# Patient Record
Sex: Female | Born: 1980 | Hispanic: Yes | Marital: Single | Smoking: Never smoker
Health system: Southern US, Community
[De-identification: ages and names within clinical notes are randomized; demographics above are authoritative.]

## PROBLEM LIST (undated history)

## (undated) DIAGNOSIS — F32A Depression, unspecified: Secondary | ICD-10-CM

## (undated) DIAGNOSIS — F329 Major depressive disorder, single episode, unspecified: Secondary | ICD-10-CM

## (undated) DIAGNOSIS — N809 Endometriosis, unspecified: Secondary | ICD-10-CM

## (undated) DIAGNOSIS — D649 Anemia, unspecified: Secondary | ICD-10-CM

## (undated) DIAGNOSIS — K219 Gastro-esophageal reflux disease without esophagitis: Secondary | ICD-10-CM

## (undated) DIAGNOSIS — J45909 Unspecified asthma, uncomplicated: Secondary | ICD-10-CM

## (undated) DIAGNOSIS — F419 Anxiety disorder, unspecified: Secondary | ICD-10-CM

## (undated) HISTORY — DX: Gastro-esophageal reflux disease without esophagitis: K21.9

## (undated) HISTORY — DX: Anemia, unspecified: D64.9

---

## 2005-09-13 ENCOUNTER — Ambulatory Visit: Payer: Self-pay | Admitting: *Deleted

## 2005-11-05 ENCOUNTER — Ambulatory Visit: Payer: Self-pay | Admitting: *Deleted

## 2005-11-22 ENCOUNTER — Ambulatory Visit: Payer: Self-pay | Admitting: Obstetrics and Gynecology

## 2006-01-11 ENCOUNTER — Ambulatory Visit: Payer: Self-pay | Admitting: Obstetrics and Gynecology

## 2007-03-09 ENCOUNTER — Inpatient Hospital Stay: Payer: Self-pay | Admitting: Obstetrics and Gynecology

## 2008-07-19 ENCOUNTER — Emergency Department: Payer: Self-pay | Admitting: Emergency Medicine

## 2008-07-31 ENCOUNTER — Emergency Department: Payer: Self-pay | Admitting: Emergency Medicine

## 2009-02-26 ENCOUNTER — Emergency Department: Payer: Self-pay | Admitting: Emergency Medicine

## 2010-04-05 ENCOUNTER — Ambulatory Visit: Payer: Self-pay | Admitting: Family Medicine

## 2010-04-28 ENCOUNTER — Ambulatory Visit: Payer: Self-pay | Admitting: Family Medicine

## 2010-06-24 ENCOUNTER — Observation Stay: Payer: Self-pay | Admitting: Obstetrics and Gynecology

## 2010-07-10 ENCOUNTER — Ambulatory Visit: Payer: Self-pay | Admitting: Family Medicine

## 2010-09-24 ENCOUNTER — Observation Stay: Payer: Self-pay | Admitting: Obstetrics and Gynecology

## 2010-10-02 ENCOUNTER — Inpatient Hospital Stay: Payer: Self-pay

## 2010-12-03 ENCOUNTER — Emergency Department: Payer: Self-pay | Admitting: Unknown Physician Specialty

## 2011-11-26 ENCOUNTER — Inpatient Hospital Stay: Payer: Self-pay

## 2011-11-26 DIAGNOSIS — R079 Chest pain, unspecified: Secondary | ICD-10-CM

## 2011-11-26 LAB — CBC WITH DIFFERENTIAL/PLATELET
Basophil #: 0 10*3/uL (ref 0.0–0.1)
Eosinophil #: 0.4 10*3/uL (ref 0.0–0.7)
Eosinophil %: 4.9 %
Lymphocyte #: 1.8 10*3/uL (ref 1.0–3.6)
MCH: 25.1 pg — ABNORMAL LOW (ref 26.0–34.0)
MCHC: 31.5 g/dL — ABNORMAL LOW (ref 32.0–36.0)
MCV: 80 fL (ref 80–100)
Monocyte #: 0.5 x10 3/mm (ref 0.2–0.9)
Platelet: 202 10*3/uL (ref 150–440)
RDW: 13.5 % (ref 11.5–14.5)

## 2011-11-26 LAB — BASIC METABOLIC PANEL
Anion Gap: 11 (ref 7–16)
BUN: 5 mg/dL — ABNORMAL LOW (ref 7–18)
Calcium, Total: 8.9 mg/dL (ref 8.5–10.1)
Co2: 23 mmol/L (ref 21–32)
Creatinine: 0.47 mg/dL — ABNORMAL LOW (ref 0.60–1.30)
EGFR (African American): >60
Osmolality: 287 (ref 275–301)
Potassium: 3.3 mmol/L — ABNORMAL LOW (ref 3.5–5.1)

## 2011-11-26 LAB — CK TOTAL AND CKMB (NOT AT ARMC)
CK, Total: 72 U/L (ref 21–215)
CK-MB: <0.5 ng/mL — ABNORMAL LOW (ref 0.5–3.6)

## 2011-12-18 ENCOUNTER — Inpatient Hospital Stay: Payer: Self-pay

## 2013-02-16 ENCOUNTER — Emergency Department: Payer: Self-pay | Admitting: Emergency Medicine

## 2013-04-30 DIAGNOSIS — J45901 Unspecified asthma with (acute) exacerbation: Secondary | ICD-10-CM | POA: Insufficient documentation

## 2014-08-24 NOTE — H&P (Signed)
    Subjective/Chief Complaint Short of breath    History of Present Illness 34 yo [id number] female at 6438 weeks gestational age by LMP.  Her pregnancy has been complicated by a history of moderate to severe persistent asthma for which she uses albuterol.  She presents with difficulty breathing not relieved by her albuterol inhaler.  She has tried 10 puffs from her albuterol inhaler. In the ED she received 2 duoneb treatments, a double albuterol treatment , and a dose of solumedrol.  With this she has wheezing.  She notes positive fetal movement, no vaginal bleeding, no leakage of fluid, and no contractions.    Past Medical Health Other, No Surgeries   Past Med/Surgical Hx:  Asthma:   ALLERGIES:  No Known Allergies:   HOME MEDICATIONS: Medication Instructions Status  albuterol  Active  multivitamin, prenatal Prenatal Multivitamins tablet 1 tab(s) orally once a day while breastfeeding Active   Family and Social History:   Family History Coronary Artery Disease  Diabetes Mellitus    Social History negative tobacco, negative ETOH, negative Illicit drugs    Place of Living Home   Review of Systems:   Subjective/Chief Complaint As noted in HPI, otherwise negative    Fever/Chills No    Abdominal Pain No    Diarrhea No    Constipation No    Nausea/Vomiting No    SOB/DOE Yes    Chest Pain No    Dysuria No   Physical Exam:   GEN well developed, well nourished, obese    HEENT hearing intact to voice    NECK supple  trachea midline    RESP normal resp effort  wheezing    CARD regular rate    ABD denies tenderness  Gravid    LYMPH negative neck    EXTR negative cyanosis/clubbing, negative edema    SKIN normal to palpation    PSYCH alert    Additional Comments Non Stress Test Performed in ED at bedside: Reactive with baseline 155/+ accels/no decels/mod variability  Vital Signs: BP 125/63, Pulse 89, O2 sats 100% on 2LNC.   Lab Results:  Routine Hem:  22-Jul-13  00:12    Hematocrit (CBC)  34.3     Assessment/Admission Diagnosis 34yo G4P3003 at 38 weeks by LMP with very limited prenatal care with an acute asthma exacerbation.    Plan - admit for management of asthma and surveillance of pregnancy in this setting. - Consult general medicine for medical management of asthma - daily NSTs - Collect GBS - Social work consult   Electronic Signatures: Conard NovakJackson, Stephen D (MD)  (Signed 22-Jul-13 02:34)  Authored: CHIEF COMPLAINT and HISTORY, PAST MEDICAL/SURGIAL HISTORY, ALLERGIES, HOME MEDICATIONS, FAMILY AND SOCIAL HISTORY, REVIEW OF SYSTEMS, PHYSICAL EXAM, LABS, ASSESSMENT AND PLAN   Last Updated: 22-Jul-13 02:34 by Conard NovakJackson, Stephen D (MD)

## 2014-08-24 NOTE — Consult Note (Signed)
PATIENT NAME:  Bailey Richardson, Bailey Richardson MR#:  [id number] DATE OF BIRTH:  1981/02/08  DATE OF CONSULTATION:  11/26/2011  REFERRING PHYSICIAN:  Dr. Jean RosenthalJackson  CONSULTING PHYSICIAN:  Richarda OverlieNayana Judson Tsan, MD  REASON FOR CONSULTATION: Asthma exacerbation   SUBJECTIVE: 34 year old female who presents to the ER with a chief complaint of shortness of breath. The patient had acute onset of dyspnea when she was trying to go to bed yesterday evening. The patient states that she could not catch her breath and this is the worst attack that she has had in a long time. According to ER documentation the patient was tachycardic but her oxygen saturation was fine on room air. She is also [redacted] weeks pregnant. Patient was unable to speak in complete sentences. Patient states that she has a history of asthma and has been using her albuterol inhaler for rescue almost on a daily basis and she used it at least 10 times today prior to coming to the Emergency Department.   PAST SURGICAL HISTORY: History of laparoscopy for endometriosis.   ALLERGIES: No known drug allergies.   HOME MEDICATIONS: Albuterol.   REVIEW OF SYSTEMS: CONSTITUTIONAL: No fever, fatigue, weakness, weight loss. EYES: No blurry vision, double vision, inflammation, glaucoma. ENT: No tinnitus, ear pain, hearing loss, epistaxis. RESPIRATORY: Positive for cough, wheezing. No hemoptysis or pneumonia. Positive for dyspnea. CARDIOVASCULAR: No chest pain, orthopnea, edema, arrhythmia. GASTROINTESTINAL: No nausea, vomiting, abdominal pain, diarrhea. GENITOURINARY: No dysuria, hematuria, renal calculi. ENDOCRINE: No polyuria, nocturia, thyroid problems, increased sweating. No anemia, easy bruising, bleeding. INTEGUMENTARY: No acne, rash, change in hair or skin. MUSCULOSKELETAL: Pain in neck, bladder, shoulder, knee. NEUROLOGICAL: No numbness, weakness, dysarthria, epilepsy.   PHYSICAL EXAMINATION:  VITAL SIGNS: Blood pressure 128/33, temperature 98.2, 98% on room air.    GENERAL: Currently comfortable, no acute cardiopulmonary distress.   HEENT: Pupils equal and reactive. Extraocular movements intact.   NECK: Supple. No JVD.   LUNGS: Bibasilar wheezing with inspiratory crackles at the bases.   CARDIOVASCULAR: Regular rate and rhythm. No murmurs, rubs, or gallops.   ABDOMEN: Appropriate for gestational age. Normoactive bowel sounds.   NEUROLOGIC: Cranial nerves II through XII grossly intact. Deep tendon reflexes 2+ bilaterally. Motor strength 5/5 in bilateral upper and lower extremities.   SKIN: Without any skin rashes.   LYMPHATIC: No axillary, inguinal, cervical lymphadenopathy.   FAMILY HISTORY: Negative for asthma, coronary artery disease or hypertension.   LABORATORY, DIAGNOSTIC AND RADIOLOGICAL DATA: Glucose 109, BUN 5, creatinine 0.47, sodium 145, potassium 3.3, anion gap 11, WBC 8.1, hemoglobin 10.8, hematocrit 34.3, platelet count 202.   ASSESSMENT AND PLAN:  1. Asthma exacerbation in a 34 year old female at 2838 weeks gestation. At this time the patient does not have any contraindications to the following medications. She will be started on Xopenex nebulizer treatments as needed. Will also add budesonide. Will also start her on IV Solu-Medrol and PPI for GI prophylaxis.  2. Dr. Jean RosenthalJackson is admitting the patient as she is [redacted] weeks pregnant.  3. She is a FULL CODE.  4. Lovenox for deep vein thrombosis prophylaxis.   TIME SPENT DOING THIS CONSULTATION: 60 minutes. ____________________________ Richarda OverlieNayana Jonmichael Beadnell, MD na:cms D: 11/26/2011 02:16:23 ET T: 11/26/2011 06:26:56 ET  JOB#: [id number] cc: Richarda OverlieNayana Jet Armbrust, MD, <Dictator>  Richarda OverlieNAYANA Mohsin Crum MD ELECTRONICALLY SIGNED 11/28/2011 3:10

## 2014-09-14 NOTE — H&P (Signed)
L&D Evaluation:  History:   HPI 34 year old G4P3 presents to L&D at 39 weeks 2 days with c/o contractions. Completely dilated on arrival. [facility] notable for late entry to care (36 weeks), obesity, closely spaced pregnancies, and asthma. Elevated 1 hour, 3 hour glucola WNL. Labs: O Positive, RI, VI, RPR NR, Hep B and HIV Negative GBS Negative.    Presents with contractions    Patient's Medical History Asthma  obesity    Patient's Surgical History none    Medications Pre Natal Vitamins  Iron    Allergies NKDA    Social History none    Family History Non-Contributory   ROS:   ROS All systems were reviewed.  HEENT, CNS, GI, GU, Respiratory, CV, Renal and Musculoskeletal systems were found to be normal.   Exam:   Vital Signs stable    Urine Protein not completed    General no apparent distress, in pain    Mental Status clear    Abdomen gravid, tender with contractions    Estimated Fetal Weight Average for gestational age    Back no CVAT    Edema no edema    Pelvic no external lesions, complete on arrival    Mebranes Intact, ruptured at time of delivery    FHT normal rate with no decels    Ucx regular    Skin dry   Impression:   Impression active labor, 39 weeks 2 days   Plan:   Plan EFM/NST, anticipate delivery   Electronic Signatures: Shella Maximutnam, Waldemar Siegel (CNM)  (Signed 13-Aug-13 13:13)  Authored: L&D Evaluation   Last Updated: 13-Aug-13 13:13 by Shella MaximPutnam, Dierra Riesgo (CNM)

## 2015-07-03 ENCOUNTER — Emergency Department: Payer: Self-pay

## 2015-07-03 ENCOUNTER — Emergency Department
Admission: EM | Admit: 2015-07-03 | Discharge: 2015-07-03 | Disposition: A | Discharge status: Home / Self Care | Payer: Self-pay | Attending: Emergency Medicine | Admitting: Emergency Medicine

## 2015-07-03 ENCOUNTER — Encounter: Payer: Self-pay | Admitting: Emergency Medicine

## 2015-07-03 DIAGNOSIS — R05 Cough: Secondary | ICD-10-CM

## 2015-07-03 DIAGNOSIS — J45901 Unspecified asthma with (acute) exacerbation: Secondary | ICD-10-CM | POA: Insufficient documentation

## 2015-07-03 DIAGNOSIS — R112 Nausea with vomiting, unspecified: Secondary | ICD-10-CM | POA: Insufficient documentation

## 2015-07-03 DIAGNOSIS — R109 Unspecified abdominal pain: Secondary | ICD-10-CM

## 2015-07-03 DIAGNOSIS — R059 Cough, unspecified: Secondary | ICD-10-CM

## 2015-07-03 DIAGNOSIS — R1013 Epigastric pain: Secondary | ICD-10-CM | POA: Insufficient documentation

## 2015-07-03 HISTORY — DX: Endometriosis, unspecified: N80.9

## 2015-07-03 HISTORY — DX: Unspecified asthma, uncomplicated: J45.909

## 2015-07-03 LAB — CBC
HCT: 41.9 % (ref 35.0–47.0)
HEMOGLOBIN: 13.8 g/dL (ref 12.0–16.0)
MCH: 25.6 pg — ABNORMAL LOW (ref 26.0–34.0)
MCHC: 33 g/dL (ref 32.0–36.0)
MCV: 77.7 fL — AB (ref 80.0–100.0)
PLATELETS: 277 10*3/uL (ref 150–440)
RBC: 5.39 MIL/uL — AB (ref 3.80–5.20)
RDW: 13.5 % (ref 11.5–14.5)
WBC: 9.2 10*3/uL (ref 3.6–11.0)

## 2015-07-03 LAB — LIPASE, BLOOD: LIPASE: 30 U/L (ref 11–51)

## 2015-07-03 LAB — COMPREHENSIVE METABOLIC PANEL
ALT: 31 U/L (ref 14–54)
ANION GAP: 11 (ref 5–15)
AST: 26 U/L (ref 15–41)
Albumin: 4.2 g/dL (ref 3.5–5.0)
Alkaline Phosphatase: 91 U/L (ref 38–126)
BUN: 16 mg/dL (ref 6–20)
CALCIUM: 9.6 mg/dL (ref 8.9–10.3)
CHLORIDE: 106 mmol/L (ref 101–111)
CO2: 22 mmol/L (ref 22–32)
CREATININE: 0.64 mg/dL (ref 0.44–1.00)
Glucose, Bld: 104 mg/dL — ABNORMAL HIGH (ref 65–99)
Potassium: 3.2 mmol/L — ABNORMAL LOW (ref 3.5–5.1)
SODIUM: 139 mmol/L (ref 135–145)
Total Bilirubin: 0.7 mg/dL (ref 0.3–1.2)
Total Protein: 8 g/dL (ref 6.5–8.1)

## 2015-07-03 LAB — TROPONIN I

## 2015-07-03 MED ORDER — SODIUM CHLORIDE 0.9 % IV SOLN
Freq: Once | INTRAVENOUS | Status: AC
  Administered 2015-07-03: 22:00:00 via INTRAVENOUS

## 2015-07-03 MED ORDER — METOCLOPRAMIDE HCL 10 MG PO TABS: 10.0000 mg | ORAL_TABLET | Freq: Three times a day (TID) | ORAL | Status: DC | PRN

## 2015-07-03 MED ORDER — LORAZEPAM 2 MG/ML IJ SOLN
INTRAMUSCULAR | Status: AC
  Administered 2015-07-03: 0.5 mg via INTRAVENOUS
  Filled 2015-07-03: qty 1

## 2015-07-03 MED ORDER — ONDANSETRON HCL 4 MG/2ML IJ SOLN
4.0000 mg | Freq: Once | INTRAMUSCULAR | Status: AC
  Administered 2015-07-03: 4 mg via INTRAVENOUS
  Filled 2015-07-03: qty 2

## 2015-07-03 MED ORDER — LORAZEPAM 1 MG PO TABS: 1.0000 mg | ORAL_TABLET | Freq: Two times a day (BID) | ORAL | Status: AC

## 2015-07-03 MED ORDER — METOCLOPRAMIDE HCL 5 MG/ML IJ SOLN
INTRAMUSCULAR | Status: AC
  Administered 2015-07-03: 10 mg via INTRAVENOUS
  Filled 2015-07-03: qty 2

## 2015-07-03 MED ORDER — LORAZEPAM 2 MG/ML IJ SOLN
0.5000 mg | Freq: Once | INTRAMUSCULAR | Status: AC
  Administered 2015-07-03: 0.5 mg via INTRAVENOUS

## 2015-07-03 MED ORDER — METOCLOPRAMIDE HCL 5 MG/ML IJ SOLN
10.0000 mg | Freq: Once | INTRAMUSCULAR | Status: AC
  Administered 2015-07-03: 10 mg via INTRAVENOUS

## 2015-07-03 MED ORDER — MORPHINE SULFATE (PF) 4 MG/ML IV SOLN
4.0000 mg | Freq: Once | INTRAVENOUS | Status: AC
  Administered 2015-07-03: 4 mg via INTRAVENOUS
  Filled 2015-07-03: qty 1

## 2015-07-03 MED ORDER — SUCRALFATE 1 G PO TABS: 1.0000 g | ORAL_TABLET | Freq: Four times a day (QID) | ORAL | Status: DC

## 2015-07-03 NOTE — ED Provider Notes (Addendum)
[facility] Emergency Department Provider Note     Time seen: ----------------------------------------- 8:57 PM on 07/03/2015 -----------------------------------------    I have reviewed the triage vital signs and the nursing notes.   HISTORY  Chief Complaint Abdominal Pain and Shortness of Breath    HPI Bailey Richardson is a 35 y.o. female who presents to ER for for 5 months of epigastric pain after eating. She has significant belching and vomiting but this is the worst that it's been. She states she feels like she can't catch her breath when she eats, last by mouth intake was at 6 PM. She's had nausea and vomiting since eating today. She states she saw her doctor about a week ago was diagnosed with acid reflux. She states an acids haven't been helping her.   Past Medical History  Diagnosis Date  . Asthma   . Endometriosis     There are no active problems to display for this patient.   History reviewed. No pertinent past surgical history.  Allergies Review of patient's allergies indicates no known allergies.  Social History Social History  Substance Use Topics  . Smoking status: Never Smoker   . Smokeless tobacco: Never Used  . Alcohol Use: No    Review of Systems Constitutional: Negative for fever. Eyes: Negative for visual changes. ENT: Negative for sore throat. Cardiovascular: Negative for chest pain. Respiratory: Negative for shortness of breath. Gastrointestinal: Positive for abdominal pain and vomiting Genitourinary: Negative for dysuria. Musculoskeletal: Negative for back pain. Skin: Negative for rash. Neurological: Negative for headaches, focal weakness or numbness.  10-point ROS otherwise negative.  ____________________________________________   PHYSICAL EXAM:  VITAL SIGNS: ED Triage Vitals  Enc Vitals Group     BP 07/03/15 2047 133/90 mmHg     Pulse Rate 07/03/15 2047 81     Resp 07/03/15 2047 22      Temp 07/03/15 2047 98.2 F (36.8 C)     Temp Source 07/03/15 2047 Oral     SpO2 07/03/15 2047 100 %     Weight 07/03/15 2047 210 lb (95.255 kg)     Height 07/03/15 2047  (1.549 m)     Head Cir --      Peak Flow --      Pain Score 07/03/15 2048 7     Pain Loc --      Pain Edu? --      Excl. in GC? --     Constitutional: Alert and oriented. Mild distress Eyes: Conjunctivae are normal. PERRL. Normal extraocular movements. ENT   Head: Normocephalic and atraumatic.   Nose: No congestion/rhinnorhea.   Mouth/Throat: Mucous membranes are moist.   Neck: No stridor. Cardiovascular: Normal rate, regular rhythm. Normal and symmetric distal pulses are present in all extremities. No murmurs, rubs, or gallops. Respiratory: Normal respiratory effort without tachypnea nor retractions. Breath sounds are clear and equal bilaterally. No wheezes/rales/rhonchi. Gastrointestinal: Soft and nontender. No distention. No abdominal bruits.  Musculoskeletal: Nontender with normal range of motion in all extremities. No joint effusions.  No lower extremity tenderness nor edema. Neurologic:  Normal speech and language. No gross focal neurologic deficits are appreciated. Speech is normal. No gait instability. Skin:  Skin is warm, dry and intact. No rash noted. Psychiatric: Mood and affect are normal. Speech and behavior are normal. Patient exhibits appropriate insight and judgment. ____________________________________________  EKG: Interpreted by me. Normal sinus rhythm with a rate of 71 bpm, normal PR interval, normal QRS, normal QT interval. Normal axis.  ____________________________________________  ED COURSE:  Pertinent labs & imaging results that were available during my care of the patient were reviewed by me and considered in my medical decision making (see chart for details). Unclear etiology for symptoms. I'll check basic labs and give IV fluid and  antiemetics. ____________________________________________    LABS (pertinent positives/negatives)  Labs Reviewed  COMPREHENSIVE METABOLIC PANEL - Abnormal; Notable for the following:    Potassium 3.2 (*)    Glucose, Bld 104 (*)    All other components within normal limits  CBC - Abnormal; Notable for the following:    RBC 5.39 (*)    MCV 77.7 (*)    MCH 25.6 (*)    All other components within normal limits  LIPASE, BLOOD  TROPONIN I  URINALYSIS COMPLETEWITH MICROSCOPIC ([facility] ONLY)  POC URINE PREG, ED    RADIOLOGY Images were viewed by me  Abdomen 2 view IMPRESSION: 1. No acute abnormality seen at the right upper quadrant. 2. Diffuse fatty infiltration within the liver. IMPRESSION: No acute abnormality noted. ____________________________________________  FINAL ASSESSMENT AND PLAN  Epigastric pain, vomiting  Plan: Patient with labs and imaging as dictated above. Patient will be encouraged to continue her an acids and gastroenterology outpatient follow-up scheduled. I will discharge her with Reglan to take for her nausea and belching. There is also the possibility that this is anxiety related. We'll prescribe Ativan for same. She is stable for outpatient follow-up.   Emily Filbert, MD   Emily Filbert, MD 07/03/15 1610  Emily Filbert, MD 07/03/15 [phone]

## 2015-07-03 NOTE — Discharge Instructions (Signed)
Gastritis, Adult Gastritis is soreness and puffiness (inflammation) of the lining of the stomach. If you do not get help, gastritis can cause bleeding and sores (ulcers) in the stomach. HOME CARE   Only take medicine as told by your doctor.  If you were given antibiotic medicines, take them as told. Finish the medicines even if you start to feel better.  Drink enough fluids to keep your pee (urine) clear or pale yellow.  Avoid foods and drinks that make your problems worse. Foods you may want to avoid include:  Caffeine or alcohol.  Chocolate.  Mint.  Garlic and onions.  Spicy foods.  Citrus fruits, including oranges, lemons, or limes.  Food containing tomatoes, including sauce, chili, salsa, and pizza.  Fried and fatty foods.  Eat small meals throughout the day instead of large meals. GET HELP RIGHT AWAY IF:   You have black or dark red poop (stools).  You throw up (vomit) blood. It may look like coffee grounds.  You cannot keep fluids down.  Your belly (abdominal) pain gets worse.  You have a fever.  You do not feel better after 1 week.  You have any other questions or concerns. MAKE SURE YOU:   Understand these instructions.  Will watch your condition.  Will get help right away if you are not doing well or get worse.   This information is not intended to replace advice given to you by your health care provider. Make sure you discuss any questions you have with your health care provider.   Document Released: 10/10/2007 Document Revised: 07/16/2011 Document Reviewed: 06/06/2011 Elsevier Interactive Patient Education 2016 Else[employer] Gastroesophageal Reflux Disease, Adult Normally, food travels down the esophagus and stays in the stomach to be digested. However, when a person has gastroesophageal reflux disease (GERD), food and stomach acid move back up into the esophagus. When this happens, the esophagus becomes sore and inflamed. Over time, GERD can  create small holes (ulcers) in the lining of the esophagus.  CAUSES This condition is caused by a problem with the muscle between the esophagus and the stomach (lower esophageal sphincter, or LES). Normally, the LES muscle closes after food passes through the esophagus to the stomach. When the LES is weakened or abnormal, it does not close properly, and that allows food and stomach acid to go back up into the esophagus. The LES can be weakened by certain dietary substances, medicines, and medical conditions, including:  Tobacco use.  Pregnancy.  Having a hiatal hernia.  Heavy alcohol use.  Certain foods and beverages, such as coffee, chocolate, onions, and peppermint. RISK FACTORS This condition is more likely to develop in:  People who have an increased body weight.  People who have connective tissue disorders.  People who use NSAID medicines. SYMPTOMS Symptoms of this condition include:  Heartburn.  Difficult or painful swallowing.  The feeling of having a lump in the throat.  Abitter taste in the mouth.  Bad breath.  Having a large amount of saliva.  Having an upset or bloated stomach.  Belching.  Chest pain.  Shortness of breath or wheezing.  Ongoing (chronic) cough or a night-time cough.  Wearing away of tooth enamel.  Weight loss. Different conditions can cause chest pain. Make sure to see your health care provider if you experience chest pain. DIAGNOSIS Your health care provider will take a medical history and perform a physical exam. To determine if you have mild or severe GERD, your health care provider may also monitor  how you respond to treatment. You may also have other tests, including:  An endoscopy toexamine your stomach and esophagus with a small camera.  A test thatmeasures the acidity level in your esophagus.  A test thatmeasures how much pressure is on your esophagus.  A barium swallow or modified barium swallow to show the shape,  size, and functioning of your esophagus. TREATMENT The goal of treatment is to help relieve your symptoms and to prevent complications. Treatment for this condition may vary depending on how severe your symptoms are. Your health care provider may recommend:  Changes to your diet.  Medicine.  Surgery. HOME CARE INSTRUCTIONS Diet  Follow a diet as recommended by your health care provider. This may involve avoiding foods and drinks such as:  Coffee and tea (with or without caffeine).  Drinks that containalcohol.  Energy drinks and sports drinks.  Carbonated drinks or sodas.  Chocolate and cocoa.  Peppermint and mint flavorings.  Garlic and onions.  Horseradish.  Spicy and acidic foods, including peppers, chili powder, curry powder, vinegar, hot sauces, and barbecue sauce.  Citrus fruit juices and citrus fruits, such as oranges, lemons, and limes.  Tomato-based foods, such as red sauce, chili, salsa, and pizza with red sauce.  Fried and fatty foods, such as donuts, french fries, potato chips, and high-fat dressings.  High-fat meats, such as hot dogs and fatty cuts of red and white meats, such as rib eye steak, sausage, ham, and bacon.  High-fat dairy items, such as whole milk, butter, and cream cheese.  Eat small, frequent meals instead of large meals.  Avoid drinking large amounts of liquid with your meals.  Avoid eating meals during the 2-3 hours before bedtime.  Avoid lying down right after you eat.  Do not exercise right after you eat. General Instructions  Pay attention to any changes in your symptoms.  Take over-the-counter and prescription medicines only as told by your health care provider. Do not take aspirin, ibuprofen, or other NSAIDs unless your health care provider told you to do so.  Do not use any tobacco products, including cigarettes, chewing tobacco, and e-cigarettes. If you need help quitting, ask your health care provider.  Wear  loose-fitting clothing. Do not wear anything tight around your waist that causes pressure on your abdomen.  Raise (elevate) the head of your bed 6 inches (15cm).  Try to reduce your stress, such as with yoga or meditation. If you need help reducing stress, ask your health care provider.  If you are overweight, reduce your weight to an amount that is healthy for you. Ask your health care provider for guidance about a safe weight loss goal.  Keep all follow-up visits as told by your health care provider. This is important. SEEK MEDICAL CARE IF:  You have new symptoms.  You have unexplained weight loss.  You have difficulty swallowing, or it hurts to swallow.  You have wheezing or a persistent cough.  Your symptoms do not improve with treatment.  You have a hoarse voice. SEEK IMMEDIATE MEDICAL CARE IF:  You have pain in your arms, neck, jaw, teeth, or back.  You feel sweaty, dizzy, or light-headed.  You have chest pain or shortness of breath.  You vomit and your vomit looks like blood or coffee grounds.  You faint.  Your stool is bloody or black.  You cannot swallow, drink, or eat.   This information is not intended to replace advice given to you by your health care provider. Make  sure you discuss any questions you have with your health care provider.   Document Released: 01/31/2005 Document Revised: 01/12/2015 Document Reviewed: 08/18/2014 Elsevier Interactive Patient Education [employer]

## 2015-07-03 NOTE — ED Notes (Signed)
Pt states for 3-4 months has had shortness of breath and upper abd pain after eating. Pt is currently hyperventilating in triage. Pt states "i feel like i can't catch my breath when i eat." last po intake per friend at 1800 today, pt states has had vomiting and nausea since eating today. Pt calms with reassurance. Skin normal color warm and dry, breath sound clear in all lobes.

## 2015-07-17 ENCOUNTER — Emergency Department: Payer: Self-pay

## 2015-07-17 ENCOUNTER — Emergency Department
Admission: EM | Admit: 2015-07-17 | Discharge: 2015-07-18 | Disposition: A | Discharge status: Home / Self Care | Payer: Self-pay | Attending: Emergency Medicine | Admitting: Emergency Medicine

## 2015-07-17 ENCOUNTER — Encounter: Payer: Self-pay | Admitting: Emergency Medicine

## 2015-07-17 DIAGNOSIS — J45901 Unspecified asthma with (acute) exacerbation: Secondary | ICD-10-CM | POA: Insufficient documentation

## 2015-07-17 DIAGNOSIS — K5900 Constipation, unspecified: Secondary | ICD-10-CM | POA: Insufficient documentation

## 2015-07-17 DIAGNOSIS — R1010 Upper abdominal pain, unspecified: Secondary | ICD-10-CM

## 2015-07-17 DIAGNOSIS — Z3202 Encounter for pregnancy test, result negative: Secondary | ICD-10-CM | POA: Insufficient documentation

## 2015-07-17 DIAGNOSIS — Z792 Long term (current) use of antibiotics: Secondary | ICD-10-CM | POA: Insufficient documentation

## 2015-07-17 DIAGNOSIS — Z79899 Other long term (current) drug therapy: Secondary | ICD-10-CM | POA: Insufficient documentation

## 2015-07-17 NOTE — ED Notes (Signed)
Pt brought in via ems, vss per ems, pt was seen for the same complaint a couple weeks ago and was also seen at [facility] for same and was told she had bacteria in her stomach, pt states that every day for the past 3 weeks she has same symptoms, doesn't matter what she eats or drinks, it makes her stomach very bloated and puts a lot of pressure in her chest making it difficult for her to breathe, pt is taking big deep breaths frequently in triage, coughing and gagging with urinary incontinence. Pt is curremtly on 2 antibiotics without relief as well as ativan

## 2015-07-18 ENCOUNTER — Emergency Department: Payer: Self-pay

## 2015-07-18 LAB — CBC WITH DIFFERENTIAL/PLATELET
BASOS PCT: 1 %
Basophils Absolute: 0 10*3/uL (ref 0–0.1)
EOS ABS: 0.2 10*3/uL (ref 0–0.7)
EOS PCT: 3 %
HCT: 41.6 % (ref 35.0–47.0)
HEMOGLOBIN: 13.9 g/dL (ref 12.0–16.0)
LYMPHS ABS: 2.8 10*3/uL (ref 1.0–3.6)
Lymphocytes Relative: 36 %
MCH: 26.1 pg (ref 26.0–34.0)
MCHC: 33.4 g/dL (ref 32.0–36.0)
MCV: 78.3 fL — ABNORMAL LOW (ref 80.0–100.0)
MONOS PCT: 5 %
Monocytes Absolute: 0.4 10*3/uL (ref 0.2–0.9)
NEUTROS PCT: 55 %
Neutro Abs: 4.3 10*3/uL (ref 1.4–6.5)
PLATELETS: 247 10*3/uL (ref 150–440)
RBC: 5.3 MIL/uL — AB (ref 3.80–5.20)
RDW: 13.7 % (ref 11.5–14.5)
WBC: 7.8 10*3/uL (ref 3.6–11.0)

## 2015-07-18 LAB — COMPREHENSIVE METABOLIC PANEL
ALBUMIN: 4.2 g/dL (ref 3.5–5.0)
ALT: 22 U/L (ref 14–54)
ANION GAP: 7 (ref 5–15)
AST: 21 U/L (ref 15–41)
Alkaline Phosphatase: 79 U/L (ref 38–126)
BUN: 11 mg/dL (ref 6–20)
CALCIUM: 9.3 mg/dL (ref 8.9–10.3)
CO2: 19 mmol/L — ABNORMAL LOW (ref 22–32)
CREATININE: 0.65 mg/dL (ref 0.44–1.00)
Chloride: 112 mmol/L — ABNORMAL HIGH (ref 101–111)
GFR calc non Af Amer: >60 mL/min (ref >60)
Glucose, Bld: 115 mg/dL — ABNORMAL HIGH (ref 65–99)
Potassium: 3.3 mmol/L — ABNORMAL LOW (ref 3.5–5.1)
SODIUM: 138 mmol/L (ref 135–145)
TOTAL PROTEIN: 7.8 g/dL (ref 6.5–8.1)
Total Bilirubin: 0.9 mg/dL (ref 0.3–1.2)

## 2015-07-18 LAB — TROPONIN I: Troponin I: <0.03 ng/mL (ref <0.031)

## 2015-07-18 LAB — POCT PREGNANCY, URINE: Preg Test, Ur: NEGATIVE

## 2015-07-18 LAB — LIPASE, BLOOD: LIPASE: 27 U/L (ref 11–51)

## 2015-07-18 MED ORDER — IOHEXOL 240 MG/ML SOLN
25.0000 mL | Freq: Once | INTRAMUSCULAR | Status: AC | PRN
  Administered 2015-07-18: 25 mL via ORAL

## 2015-07-18 MED ORDER — POLYETHYLENE GLYCOL 3350 17 G PO PACK: 17.0000 g | PACK | Freq: Every day | ORAL | Status: DC

## 2015-07-18 MED ORDER — MORPHINE SULFATE (PF) 4 MG/ML IV SOLN
4.0000 mg | Freq: Once | INTRAVENOUS | Status: AC
  Administered 2015-07-18: 4 mg via INTRAVENOUS
  Filled 2015-07-18: qty 1

## 2015-07-18 MED ORDER — SODIUM CHLORIDE 0.9 % IV BOLUS (SEPSIS)
1000.0000 mL | Freq: Once | INTRAVENOUS | Status: AC
  Administered 2015-07-18: 1000 mL via INTRAVENOUS

## 2015-07-18 MED ORDER — ONDANSETRON HCL 4 MG/2ML IJ SOLN
4.0000 mg | Freq: Once | INTRAMUSCULAR | Status: AC
  Administered 2015-07-18: 4 mg via INTRAVENOUS
  Filled 2015-07-18: qty 2

## 2015-07-18 MED ORDER — IOHEXOL 300 MG/ML  SOLN
125.0000 mL | Freq: Once | INTRAMUSCULAR | Status: AC | PRN
  Administered 2015-07-18: 125 mL via INTRAVENOUS

## 2015-07-18 NOTE — ED Provider Notes (Signed)
[facility] Emergency Department Provider Note  ____________________________________________  Time seen: Approximately 0020 AM  I have reviewed the triage vital signs and the nursing notes.   HISTORY  Chief Complaint Respiratory Distress    HPI Bailey Richardson is a 35 y.o. female who comes into the hospital reporting that she's been sick for the past 3 weeks. The patient reports it was worse in the past week. The patient reports her abdomen feels bloated and feels like a pressure in her chest and she can't breathe. The patient has been vomiting and not passing gas for the last 2 hours. The patient's last bowel movement was this morning but it was hard balls. She reports that she's been unable to eat because everything comes back up not digested. She reports that she had similar symptoms 8 months ago but it stopped on its own. The patient reports then that she had to stop eating multiple things in an effort for her symptoms to resolve itself. The patient rates her pain a 6 out of 10 in intensity but reports that she feels more pressure in her chest much she cannot breathe. She was given medicine for GERD and H. pylori at her doctor's office but she reports that nothing is working. The patient reports that she was told to follow-up with GI but they cannot see her for an endoscopy until June. The patient reports that she is unable to wait so she decided to come into the hospital for evaluation.   Past Medical History  Diagnosis Date  . Asthma   . Endometriosis     There are no active problems to display for this patient.   History reviewed. No pertinent past surgical history.  Current Outpatient Rx  Name  Route  Sig  Dispense  Refill  . amoxicillin (AMOXIL) 500 MG capsule   Oral   Take 1,000 mg by mouth 2 (two) times daily.         . clarithromycin (BIAXIN) 500 MG tablet   Oral   Take 500 mg by mouth 2 (two) times daily.         Marland Kitchen omeprazole  (PRILOSEC) 20 MG capsule   Oral   Take 20 mg by mouth 2 (two) times daily before a meal.         . LORazepam (ATIVAN) 1 MG tablet   Oral   Take 1 tablet (1 mg total) by mouth 2 (two) times daily.   20 tablet   0   . metoCLOPramide (REGLAN) 10 MG tablet   Oral   Take 1 tablet (10 mg total) by mouth every 8 (eight) hours as needed for nausea or vomiting.   30 tablet   1   . polyethylene glycol (MIRALAX) packet   Oral   Take 17 g by mouth daily.   14 each   0   . sucralfate (CARAFATE) 1 g tablet   Oral   Take 1 tablet (1 g total) by mouth 4 (four) times daily.   120 tablet   1     Allergies Review of patient's allergies indicates no known allergies.  History reviewed. No pertinent family history.  Social History Social History  Substance Use Topics  . Smoking status: Never Smoker   . Smokeless tobacco: Never Used  . Alcohol Use: No    Review of Systems Constitutional: No fever/chills Eyes: No visual changes. ENT: No sore throat. Cardiovascular:  chest pain. Respiratory: shortness of breath. Gastrointestinal:  abdominal pain.  Nausea vomiting and constipation Genitourinary: Negative for dysuria. Musculoskeletal: Negative for back pain. Skin: Negative for rash. Neurological: Negative for headaches, focal weakness or numbness.  10-point ROS otherwise negative.  ____________________________________________   PHYSICAL EXAM:  VITAL SIGNS: ED Triage Vitals  Enc Vitals Group     BP 07/17/15 2317 116/93 mmHg     Pulse Rate 07/17/15 2317 80     Resp 07/17/15 2317 22     Temp 07/17/15 2317 98.3 F (36.8 C)     Temp Source 07/17/15 2317 Oral     SpO2 07/17/15 2317 100 %     Weight 07/17/15 2317 206 lb (93.441 kg)     Height 07/17/15 2317  (1.6 m)     Head Cir --      Peak Flow --      Pain Score 07/17/15 2319 6     Pain Loc --      Pain Edu? --      Excl. in GC? --     Constitutional: Alert and oriented. Well appearing and in moderate  distress. Eyes: Conjunctivae are normal. PERRL. EOMI. Head: Atraumatic. Nose: No congestion/rhinnorhea. Mouth/Throat: Mucous membranes are moist.  Oropharynx non-erythematous. Cardiovascular: Normal rate, regular rhythm. Grossly normal heart sounds.  Good peripheral circulation. Respiratory: Normal respiratory effort.  No retractions. Lungs CTAB. Gastrointestinal: Soft with upper abdomen tenderness to palpation. No distention. No abdominal bruits. Positive bowel sounds Musculoskeletal: No lower extremity tenderness nor edema.   Neurologic:  Normal speech and language.  Skin:  Skin is warm, dry and intact.  Psychiatric: Mood and affect are normal.   ____________________________________________   LABS (all labs ordered are listed, but only abnormal results are displayed)  Labs Reviewed  COMPREHENSIVE METABOLIC PANEL - Abnormal; Notable for the following:    Potassium 3.3 (*)    Chloride 112 (*)    CO2 19 (*)    Glucose, Bld 115 (*)    All other components within normal limits  CBC WITH DIFFERENTIAL/PLATELET - Abnormal; Notable for the following:    RBC 5.30 (*)    MCV 78.3 (*)    All other components within normal limits  TROPONIN I  LIPASE, BLOOD  POC URINE PREG, ED  POCT PREGNANCY, URINE   ____________________________________________  EKG  ED ECG REPORT I, Rebecka Apley, the attending physician, personally viewed and interpreted this ECG.   Date: 07/17/2015  EKG Time: 2326  Rate: 79  Rhythm: normal sinus rhythm  Axis: Normal  Intervals:none  ST&T Change: None  ____________________________________________  RADIOLOGY  CT abdomen and pelvis: No acute abnormality seen within the abdomen or pelvis, Scattered diverticulosis along the descending and proximal sigmoid colon without evidence of diverticulitis  CXR: Mild vascular congestion noted, lungs remain grossly clear ____________________________________________   PROCEDURES  Procedure(s) performed:  None  Critical Care performed: No  ____________________________________________   INITIAL IMPRESSION / ASSESSMENT AND PLAN / ED COURSE  Pertinent labs & imaging results that were available during my care of the patient were reviewed by me and considered in my medical decision making (see chart for details).  This is a 35 year old female who comes into the hospital today with some upper abdominal pain, vomiting as well as abdominal bloating and constipation. The patient has been having these symptoms for multiple months but has not had any imaging done. I did give the patient a dose of morphine and Zofran and she will receive a CT scan of her abdomen and pelvis. I will reassess the  patient once she's received her imaging.  Patient's CT scan is unremarkable. The patient has a prescription for Reglan as well as Carafate and medications to treat her H. pylori. I feel that the patient still needs to follow up with GI for further evaluation of her symptoms. I will treat the patient with some MiraLAX for her constipation and have her follow back up with her primary care physician. The patient will be discharged home. ____________________________________________   FINAL CLINICAL IMPRESSION(S) / ED DIAGNOSES  Final diagnoses:  Pain of upper abdomen  Constipation, unspecified constipation type      Rebecka ApleyAllison P Webster, MD 07/18/15 [phone]

## 2015-07-18 NOTE — ED Notes (Signed)
Pt returned from CT °

## 2015-07-18 NOTE — ED Notes (Signed)
Patient transported to CT 

## 2015-07-18 NOTE — ED Notes (Signed)
Report received from laurie, rn for lunch relief.

## 2015-07-18 NOTE — ED Notes (Signed)
MD at bedside. 

## 2015-07-18 NOTE — Discharge Instructions (Signed)
Constipation, Adult °Constipation is when a person has fewer than three bowel movements a week, has difficulty having a bowel movement, or has stools that are dry, hard, or larger than normal. As people grow older, constipation is more common. A low-fiber diet, not taking in enough fluids, and taking certain medicines may make constipation worse.  °CAUSES  °· Certain medicines, such as antidepressants, pain medicine, iron supplements, antacids, and water pills.   °· Certain diseases, such as diabetes, irritable bowel syndrome (IBS), thyroid disease, or depression.   °· Not drinking enough water.   °· Not eating enough fiber-rich foods.   °· Stress or travel.   °· Lack of physical activity or exercise.   °· Ignoring the urge to have a bowel movement.   °· Using laxatives too much.   °SIGNS AND SYMPTOMS  °· Having fewer than three bowel movements a week.   °· Straining to have a bowel movement.   °· Having stools that are hard, dry, or larger than normal.   °· Feeling full or bloated.   °· Pain in the lower abdomen.   °· Not feeling relief after having a bowel movement.   °DIAGNOSIS  °Your health care provider will take a medical history and perform a physical exam. Further testing may be done for severe constipation. Some tests may include: °· A barium enema X-ray to examine your rectum, colon, and, sometimes, your small intestine.   °· A sigmoidoscopy to examine your lower colon.   °· A colonoscopy to examine your entire colon. °TREATMENT  °Treatment will depend on the severity of your constipation and what is causing it. Some dietary treatments include drinking more fluids and eating more fiber-rich foods. Lifestyle treatments may include regular exercise. If these diet and lifestyle recommendations do not help, your health care provider may recommend taking over-the-counter laxative medicines to help you have bowel movements. Prescription medicines may be prescribed if over-the-counter medicines do not work.    °HOME CARE INSTRUCTIONS  °· Eat foods that have a lot of fiber, such as fruits, vegetables, whole grains, and beans. °· Limit foods high in fat and processed sugars, such as french fries, hamburgers, cookies, candies, and soda.   °· A fiber supplement may be added to your diet if you cannot get enough fiber from foods.   °· Drink enough fluids to keep your urine clear or pale yellow.   °· Exercise regularly or as directed by your health care provider.   °· Go to the restroom when you have the urge to go. Do not hold it.   °· Only take over-the-counter or prescription medicines as directed by your health care provider. Do not take other medicines for constipation without talking to your health care provider first.   °SEEK IMMEDIATE MEDICAL CARE IF:  °· You have bright red blood in your stool.   °· Your constipation lasts for more than 4 days or gets worse.   °· You have abdominal or rectal pain.   °· You have thin, pencil-like stools.   °· You have unexplained weight loss. °MAKE SURE YOU:  °· Understand these instructions. °· Will watch your condition. °· Will get help right away if you are not doing well or get worse. °  °This information is not intended to replace advice given to you by your health care provider. Make sure you discuss any questions you have with your health care provider. °  °Document Released: 01/20/2004 Document Revised: 05/14/2014 Document Reviewed: 02/02/2013 °Elsevier Interactive Patient Education ©2016 [employer] ° °Abdominal Pain, Adult °Many things can cause abdominal pain. Usually, abdominal pain is not caused by a disease and   will improve without treatment. It can often be observed and treated at home. Your health care provider will do a physical exam and possibly order blood tests and X-rays to help determine the seriousness of your pain. However, in many cases, more time must pass before a clear cause of the pain can be found. Before that point, your health care provider may not  know if you need more testing or further treatment. °HOME CARE INSTRUCTIONS °Monitor your abdominal pain for any changes. The following actions may help to alleviate any discomfort you are experiencing: °· Only take over-the-counter or prescription medicines as directed by your health care provider. °· Do not take laxatives unless directed to do so by your health care provider. °· Try a clear liquid diet (broth, tea, or water) as directed by your health care provider. Slowly move to a bland diet as tolerated. °SEEK MEDICAL CARE IF: °· You have unexplained abdominal pain. °· You have abdominal pain associated with nausea or diarrhea. °· You have pain when you urinate or have a bowel movement. °· You experience abdominal pain that wakes you in the night. °· You have abdominal pain that is worsened or improved by eating food. °· You have abdominal pain that is worsened with eating fatty foods. °· You have a fever. °SEEK IMMEDIATE MEDICAL CARE IF: °· Your pain does not go away within 2 hours. °· You keep throwing up (vomiting). °· Your pain is felt only in portions of the abdomen, such as the right side or the left lower portion of the abdomen. °· You pass bloody or black tarry stools. °MAKE SURE YOU: °· Understand these instructions. °· Will watch your condition. °· Will get help right away if you are not doing well or get worse. °  °This information is not intended to replace advice given to you by your health care provider. Make sure you discuss any questions you have with your health care provider. °  °Document Released: 01/31/2005 Document Revised: 01/12/2015 Document Reviewed: 12/31/2012 °Elsevier Interactive Patient Education ©2016 [employer] ° °

## 2015-07-18 NOTE — ED Notes (Signed)
Pt provided with cup and wipes-up to bathroom with steady gait to collect urine specimen

## 2015-08-01 DIAGNOSIS — J45909 Unspecified asthma, uncomplicated: Secondary | ICD-10-CM | POA: Insufficient documentation

## 2015-08-01 DIAGNOSIS — R1013 Epigastric pain: Secondary | ICD-10-CM | POA: Insufficient documentation

## 2015-08-01 LAB — URINALYSIS COMPLETE WITH MICROSCOPIC (ARMC ONLY)
BACTERIA UA: NONE SEEN
Bilirubin Urine: NEGATIVE
Glucose, UA: NEGATIVE mg/dL
Leukocytes, UA: NEGATIVE
NITRITE: NEGATIVE
PH: 6 (ref 5.0–8.0)
PROTEIN: NEGATIVE mg/dL
SPECIFIC GRAVITY, URINE: 1.025 (ref 1.005–1.030)

## 2015-08-01 LAB — COMPREHENSIVE METABOLIC PANEL
ALBUMIN: 4 g/dL (ref 3.5–5.0)
ALK PHOS: 73 U/L (ref 38–126)
ALT: 24 U/L (ref 14–54)
ANION GAP: 6 (ref 5–15)
AST: 20 U/L (ref 15–41)
BUN: 14 mg/dL (ref 6–20)
CALCIUM: 9.1 mg/dL (ref 8.9–10.3)
CHLORIDE: 111 mmol/L (ref 101–111)
CO2: 19 mmol/L — AB (ref 22–32)
CREATININE: 0.59 mg/dL (ref 0.44–1.00)
GFR calc Af Amer: >60 mL/min (ref >60)
GFR calc non Af Amer: >60 mL/min (ref >60)
GLUCOSE: 80 mg/dL (ref 65–99)
Potassium: 3.4 mmol/L — ABNORMAL LOW (ref 3.5–5.1)
SODIUM: 136 mmol/L (ref 135–145)
Total Bilirubin: 1.2 mg/dL (ref 0.3–1.2)
Total Protein: 7.3 g/dL (ref 6.5–8.1)

## 2015-08-01 LAB — CBC
HCT: 39.3 % (ref 35.0–47.0)
Hemoglobin: 13 g/dL (ref 12.0–16.0)
MCH: 25.5 pg — ABNORMAL LOW (ref 26.0–34.0)
MCHC: 33.1 g/dL (ref 32.0–36.0)
MCV: 77.1 fL — ABNORMAL LOW (ref 80.0–100.0)
PLATELETS: 229 10*3/uL (ref 150–440)
RBC: 5.1 MIL/uL (ref 3.80–5.20)
RDW: 13.3 % (ref 11.5–14.5)
WBC: 6.7 10*3/uL (ref 3.6–11.0)

## 2015-08-01 LAB — TROPONIN I: Troponin I: <0.03 ng/mL (ref <0.031)

## 2015-08-01 LAB — LIPASE, BLOOD: LIPASE: 22 U/L (ref 11–51)

## 2015-08-01 LAB — POCT PREGNANCY, URINE: Preg Test, Ur: NEGATIVE

## 2015-08-01 NOTE — ED Notes (Signed)
Pt in with co chest tightness for a month has been taking ativan without relief.  Also co abd distention no vomiting or diarrhea.

## 2015-08-02 ENCOUNTER — Encounter: Payer: Self-pay | Admitting: Emergency Medicine

## 2015-08-02 ENCOUNTER — Emergency Department
Admission: EM | Admit: 2015-08-02 | Discharge: 2015-08-02 | Disposition: A | Discharge status: Home / Self Care | Payer: Self-pay | Attending: Emergency Medicine | Admitting: Emergency Medicine

## 2015-08-02 DIAGNOSIS — R1013 Epigastric pain: Secondary | ICD-10-CM

## 2015-08-02 LAB — TROPONIN I

## 2015-08-02 LAB — FIBRIN DERIVATIVES D-DIMER (ARMC ONLY): Fibrin derivatives D-dimer (ARMC): 305 (ref 0–499)

## 2015-08-02 NOTE — Discharge Instructions (Signed)

## 2015-08-02 NOTE — ED Notes (Signed)

## 2015-08-02 NOTE — ED Provider Notes (Signed)
[facility] Emergency Department Provider Note  ____________________________________________  Time seen: 3:00 AM  I have reviewed the triage vital signs and the nursing notes.   HISTORY  Chief Complaint Chest Pain     HPI Bailey Richardson is a 35 y.o. female presents to the emergency department with chest tightness times approximately one month. Patient also admits to abdominal distention. Patient was seen on 07/18/2015 with same complaint and diagnosed with epigastric pain at that time with concern for possible ulcer. Patient states her pain is described as pressure nonradiating recurrent score 7. Patient states that she also saw her primary care provider following her last emergency department visit and was diagnosed with anxiety and given Ativan however that has not improved her pain.   Past Medical History  Diagnosis Date  . Asthma   . Endometriosis     There are no active problems to display for this patient.   History reviewed. No pertinent past surgical history.  Current Outpatient Rx  Name  Route  Sig  Dispense  Refill  . amoxicillin (AMOXIL) 500 MG capsule   Oral   Take 1,000 mg by mouth 2 (two) times daily.         . clarithromycin (BIAXIN) 500 MG tablet   Oral   Take 500 mg by mouth 2 (two) times daily.         Marland Kitchen. LORazepam (ATIVAN) 1 MG tablet   Oral   Take 1 tablet (1 mg total) by mouth 2 (two) times daily.   20 tablet   0   . metoCLOPramide (REGLAN) 10 MG tablet   Oral   Take 1 tablet (10 mg total) by mouth every 8 (eight) hours as needed for nausea or vomiting.   30 tablet   1   . omeprazole (PRILOSEC) 20 MG capsule   Oral   Take 20 mg by mouth 2 (two) times daily before a meal.         . polyethylene glycol (MIRALAX) packet   Oral   Take 17 g by mouth daily.   14 each   0   . sucralfate (CARAFATE) 1 g tablet   Oral   Take 1 tablet (1 g total) by mouth 4 (four) times daily.   120 tablet   1      Allergies No known drug allergies No family history on file.  Social History Social History  Substance Use Topics  . Smoking status: Never Smoker   . Smokeless tobacco: Never Used  . Alcohol Use: No    Review of Systems  Constitutional: Negative for fever. Eyes: Negative for visual changes. ENT: Negative for sore throat. Cardiovascular: Positive for chest pain. Respiratory: Negative for shortness of breath. Gastrointestinal: Negative for abdominal pain, vomiting and diarrhea. Genitourinary: Negative for dysuria. Musculoskeletal: Negative for back pain. Skin: Negative for rash. Neurological: Negative for headaches, focal weakness or numbness.   10-point ROS otherwise negative.  ____________________________________________   PHYSICAL EXAM:  VITAL SIGNS: ED Triage Vitals  Enc Vitals Group     BP 08/01/15 2228 121/92 mmHg     Pulse Rate 08/01/15 2228 61     Resp 08/01/15 2228 18     Temp 08/01/15 2228 97.8 F (36.6 C)     Temp Source 08/01/15 2228 Oral     SpO2 08/01/15 2228 97 %     Weight 08/01/15 2228 200 lb (90.719 kg)     Height 08/01/15 2228 5\' 3"  (1.6 m)  Head Cir --      Peak Flow --      Pain Score 08/01/15 2229 6     Pain Loc --      Pain Edu? --      Excl. in GC? --     Constitutional: Alert and oriented. Well appearing and in no distress. Eyes: Conjunctivae are normal. PERRL. Normal extraocular movements. ENT   Head: Normocephalic and atraumatic.   Nose: No congestion/rhinnorhea.   Mouth/Throat: Mucous membranes are moist.   Neck: No stridor. Hematological/Lymphatic/Immunilogical: No cervical lymphadenopathy. Cardiovascular: Normal rate, regular rhythm. Normal and symmetric distal pulses are present in all extremities. No murmurs, rubs, or gallops. Respiratory: Normal respiratory effort without tachypnea nor retractions. Breath sounds are clear and equal bilaterally. No wheezes/rales/rhonchi. Gastrointestinal: Soft and  nontender. No distention. There is no CVA tenderness. Genitourinary: deferred Musculoskeletal: Nontender with normal range of motion in all extremities. No joint effusions.  No lower extremity tenderness nor edema. Neurologic:  Normal speech and language. No gross focal neurologic deficits are appreciated. Speech is normal.  Skin:  Skin is warm, dry and intact. No rash noted. Psychiatric: Mood and affect are normal. Speech and behavior are normal. Patient exhibits appropriate insight and judgment.  ____________________________________________    LABS (pertinent positives/negatives)  Labs Reviewed  CBC - Abnormal; Notable for the following:    MCV 77.1 (*)    MCH 25.5 (*)    All other components within normal limits  COMPREHENSIVE METABOLIC PANEL - Abnormal; Notable for the following:    Potassium 3.4 (*)    CO2 19 (*)    All other components within normal limits  URINALYSIS COMPLETEWITH MICROSCOPIC ([facility] ONLY) - Abnormal; Notable for the following:    Color, Urine YELLOW (*)    APPearance CLEAR (*)    Ketones, ur 2+ (*)    Hgb urine dipstick 1+ (*)    Squamous Epithelial / LPF 0-5 (*)    All other components within normal limits  TROPONIN I  LIPASE, BLOOD  FIBRIN DERIVATIVES D-DIMER ([facility] ONLY)  TROPONIN I  POC URINE PREG, ED  POCT PREGNANCY, URINE     ____________________________________________   EKG  ED ECG REPORT I, Espy N Conan Mcmanaway, the attending physician, personally viewed and interpreted this ECG.   Date: 08/02/2015  EKG Time: 10:34 PM  Rate: 58  Rhythm: Sinus bradycardia  Axis: Normal  Intervals: Normal  ST&T Change: None      INITIAL IMPRESSION / ASSESSMENT AND PLAN / ED COURSE  Pertinent labs & imaging results that were available during my care of the patient were reviewed by me and considered in my medical decision making (see chart for details).  Cardiac enzymes negative 2 d-dimer negative as well in a patient with PERC 0. Possible peptic  ulcer disease as etiology of the patient's pain as such we'll refer the patient to gastroenterology.  ____________________________________________   FINAL CLINICAL IMPRESSION(S) / ED DIAGNOSES  Final diagnoses:  Epigastric pain      Darci Current, MD 08/02/15 [phone]

## 2017-09-01 ENCOUNTER — Other Ambulatory Visit: Payer: Self-pay

## 2017-09-01 ENCOUNTER — Emergency Department: Payer: Self-pay

## 2017-09-01 ENCOUNTER — Emergency Department
Admission: EM | Admit: 2017-09-01 | Discharge: 2017-09-01 | Disposition: A | Discharge status: Home / Self Care | Payer: Self-pay | Attending: Emergency Medicine | Admitting: Emergency Medicine

## 2017-09-01 DIAGNOSIS — J45901 Unspecified asthma with (acute) exacerbation: Secondary | ICD-10-CM | POA: Insufficient documentation

## 2017-09-01 MED ORDER — PREDNISONE 10 MG PO TABS: ORAL_TABLET | ORAL | 0 refills | Status: DC

## 2017-09-01 MED ORDER — IPRATROPIUM-ALBUTEROL 0.5-2.5 (3) MG/3ML IN SOLN
RESPIRATORY_TRACT | Status: AC
  Filled 2017-09-01: qty 3

## 2017-09-01 MED ORDER — METHYLPREDNISOLONE SODIUM SUCC 125 MG IJ SOLR
125.0000 mg | Freq: Once | INTRAMUSCULAR | Status: AC
  Administered 2017-09-01: 125 mg via INTRAMUSCULAR
  Filled 2017-09-01: qty 2

## 2017-09-01 MED ORDER — IPRATROPIUM-ALBUTEROL 0.5-2.5 (3) MG/3ML IN SOLN
3.0000 mL | Freq: Once | RESPIRATORY_TRACT | Status: AC
  Administered 2017-09-01: 3 mL via RESPIRATORY_TRACT
  Filled 2017-09-01: qty 3

## 2017-09-01 NOTE — ED Notes (Signed)
Pt taken to xray 

## 2017-09-01 NOTE — ED Triage Notes (Signed)
Reports feels like asthma is acting up.  Reports symptoms for 2 days.

## 2017-09-01 NOTE — ED Provider Notes (Signed)
[facility] Emergency Department Provider Note  ____________________________________________  Time seen: Approximately 7:39 AM  I have reviewed the triage vital signs and the nursing notes.   HISTORY  Chief Complaint Shortness of Breath    HPI Bailey Richardson is a 37 y.o. female that presents to the emergency department for evaluation of increased shortness of breath and non productive cough for 2 days.  Patient has a history of asthma and this feels the same.  She used her inhaler 10 times last night with minimal relief.  Last asthma exacerbation was one year ago.  She has been congested but states that she stays congested.  She has a history of seasonal allergies.  She does not smoke.  She denies fever, chills, chest pain.  Past Medical History:  Diagnosis Date  . Asthma   . Endometriosis     There are no active problems to display for this patient.   No past surgical history on file.  Prior to Admission medications   Medication Sig Start Date End Date Taking? Authorizing Provider  amoxicillin (AMOXIL) 500 MG capsule Take 1,000 mg by mouth 2 (two) times daily.    [provider]  clarithromycin (BIAXIN) 500 MG tablet Take 500 mg by mouth 2 (two) times daily.    [provider]  metoCLOPramide (REGLAN) 10 MG tablet Take 1 tablet (10 mg total) by mouth every 8 (eight) hours as needed for nausea or vomiting. 07/03/15   Emily Filbert, MD  omeprazole (PRILOSEC) 20 MG capsule Take 20 mg by mouth 2 (two) times daily before a meal.    [provider]  polyethylene glycol (MIRALAX) packet Take 17 g by mouth daily. 07/18/15   Rebecka Apley, MD  predniSONE (DELTASONE) 10 MG tablet Take 6 tablets on day 1, take 5 tablets on day 2, take 4 tablets on day 3, take 3 tablets on day 4, take 2 tablets on day 5, take 1 tablet on day 6 09/01/17   Enid Derry, PA-C  sucralfate (CARAFATE) 1 g tablet Take 1 tablet (1 g total) by  mouth 4 (four) times daily. 07/03/15 07/02/16  Emily Filbert, MD    Allergies Patient has no known allergies.  No family history on file.  Social History Social History   Tobacco Use  . Smoking status: Never Smoker  . Smokeless tobacco: Never Used  Substance Use Topics  . Alcohol use: No  . Drug use: Not on file     Review of Systems  Constitutional: No fever/chills ENT: No upper respiratory complaints. Cardiovascular: No chest pain. Respiratory: Positive for cough and SOB Gastrointestinal: No abdominal pain.  No nausea, no vomiting.  Musculoskeletal: Negative for musculoskeletal pain. Skin: Negative for rash, abrasions, lacerations, ecchymosis. Neurological: Negative for headaches   ____________________________________________   PHYSICAL EXAM:  VITAL SIGNS: ED Triage Vitals  Enc Vitals Group     BP 09/01/17 0035 126/65     Pulse Rate 09/01/17 0035 85     Resp 09/01/17 0035 20     Temp 09/01/17 0035 98.1 F (36.7 C)     Temp Source 09/01/17 0035 Oral     SpO2 09/01/17 0035 96 %     Weight 09/01/17 0036 210 lb (95.3 kg)     Height 09/01/17 0036  (1.6 m)     Head Circumference --      Peak Flow --      Pain Score 09/01/17 0630 7     Pain  Loc --      Pain Edu? --      Excl. in GC? --      Constitutional: Alert and oriented. Well appearing and in no acute distress. Eyes: Conjunctivae are normal. PERRL. EOMI. Head: Atraumatic. ENT:      Ears:      Nose: No congestion/rhinnorhea.      Mouth/Throat: Mucous membranes are moist.  Neck: No stridor.   Cardiovascular: Normal rate, regular rhythm.  Good peripheral circulation. Respiratory: Normal respiratory effort without tachypnea or retractions. Scattered wheezes. Good air entry to the bases with no decreased or absent breath sounds. Musculoskeletal: Full range of motion to all extremities. No gross deformities appreciated. Neurologic:  Normal speech and language. No gross focal neurologic deficits  are appreciated.  Skin:  Skin is warm, dry and intact. No rash noted.   ____________________________________________   LABS (all labs ordered are listed, but only abnormal results are displayed)  Labs Reviewed - No data to display ____________________________________________  EKG   ____________________________________________  RADIOLOGY Lexine Baton, personally viewed and evaluated these images (plain radiographs) as part of my medical decision making, as well as reviewing the written report by the radiologist.  Dg Chest 2 View  Result Date: 09/01/2017 CLINICAL DATA:  Nonsmoker.  Asthma. EXAM: CHEST - 2 VIEW COMPARISON:  July 17, 2015 FINDINGS: The heart size and mediastinal contours are within normal limits. Both lungs are clear. The visualized skeletal structures are unremarkable. IMPRESSION: No active cardiopulmonary disease. Electronically Signed   By: Gerome Sam III M.D   On: 09/01/2017 07:45    ____________________________________________    PROCEDURES  Procedure(s) performed:    Procedures    Medications  ipratropium-albuterol (DUONEB) 0.5-2.5 (3) MG/3ML nebulizer solution 3 mL (3 mLs Nebulization Given 09/01/17 0759)  methylPREDNISolone sodium succinate (SOLU-MEDROL) 125 mg/2 mL injection 125 mg (125 mg Intramuscular Given 09/01/17 0759)     ____________________________________________   INITIAL IMPRESSION / ASSESSMENT AND PLAN / ED COURSE  Pertinent labs & imaging results that were available during my care of the patient were reviewed by me and considered in my medical decision making (see chart for details).  Review of the [place] CSRS was performed in accordance of the [facility] prior to dispensing any controlled drugs.   Patient's diagnosis is consistent with asthma exacerbation.  Vital signs and exam are reassuring.  Chest x-ray negative for acute cardiopulmonary processes.  Shortness of breath and wheezing cleared with DuoNeb.  IM Solu-Medrol was  given.  Patient will be discharged home with prescriptions for prednisone. She does not need a refill on her albuterol inhaler. Patient is to follow up with PCP as directed. Patient is given ED precautions to return to the ED for any worsening or new symptoms.     ____________________________________________  FINAL CLINICAL IMPRESSION(S) / ED DIAGNOSES  Final diagnoses:  Asthma with acute exacerbation, unspecified asthma severity, unspecified whether persistent      NEW MEDICATIONS STARTED DURING THIS VISIT:  ED Discharge Orders        Ordered    predniSONE (DELTASONE) 10 MG tablet     09/01/17 2130          This chart was dictated using voice recognition software/Dragon. Despite best efforts to proofread, errors can occur which can change the meaning. Any change was purely unintentional.    Enid Derry, PA-C 09/01/17 8657    Loleta Rose, MD 09/01/17 1045

## 2018-01-29 LAB — HM PAP SMEAR

## 2018-01-30 ENCOUNTER — Other Ambulatory Visit: Payer: Self-pay | Admitting: Advanced Practice Midwife

## 2018-01-30 DIAGNOSIS — O09521 Supervision of elderly multigravida, first trimester: Secondary | ICD-10-CM

## 2018-01-30 DIAGNOSIS — Z3689 Encounter for other specified antenatal screening: Secondary | ICD-10-CM

## 2018-01-30 LAB — OB RESULTS CONSOLE RUBELLA ANTIBODY, IGM: Rubella: IMMUNE

## 2018-01-30 LAB — OB RESULTS CONSOLE HEPATITIS B SURFACE ANTIGEN: Hepatitis B Surface Ag: NEGATIVE

## 2018-01-30 LAB — OB RESULTS CONSOLE VARICELLA ZOSTER ANTIBODY, IGG: Varicella: IMMUNE

## 2018-02-03 DIAGNOSIS — R87619 Unspecified abnormal cytological findings in specimens from cervix uteri: Secondary | ICD-10-CM | POA: Insufficient documentation

## 2018-02-10 ENCOUNTER — Ambulatory Visit
Admission: RE | Admit: 2018-02-10 | Discharge: 2018-02-10 | Disposition: A | Discharge status: Home / Self Care | Payer: Self-pay | Source: Ambulatory Visit | Attending: Advanced Practice Midwife | Admitting: Advanced Practice Midwife

## 2018-02-10 ENCOUNTER — Ambulatory Visit
Admission: RE | Admit: 2018-02-10 | Discharge: 2018-02-10 | Disposition: A | Discharge status: Home / Self Care | Payer: Self-pay | Source: Ambulatory Visit | Attending: Obstetrics and Gynecology | Admitting: Obstetrics and Gynecology

## 2018-02-10 DIAGNOSIS — Z3689 Encounter for other specified antenatal screening: Secondary | ICD-10-CM | POA: Insufficient documentation

## 2018-02-10 DIAGNOSIS — O09521 Supervision of elderly multigravida, first trimester: Secondary | ICD-10-CM

## 2018-02-10 DIAGNOSIS — O99212 Obesity complicating pregnancy, second trimester: Secondary | ICD-10-CM | POA: Insufficient documentation

## 2018-02-10 DIAGNOSIS — Z3A17 17 weeks gestation of pregnancy: Secondary | ICD-10-CM | POA: Insufficient documentation

## 2018-02-10 HISTORY — DX: Anxiety disorder, unspecified: F41.9

## 2018-02-10 HISTORY — DX: Depression, unspecified: F32.A

## 2018-02-10 HISTORY — DX: Major depressive disorder, single episode, unspecified: F32.9

## 2018-02-20 ENCOUNTER — Other Ambulatory Visit: Payer: Self-pay

## 2018-02-20 ENCOUNTER — Telehealth: Payer: Self-pay | Admitting: Obstetrics and Gynecology

## 2018-02-20 DIAGNOSIS — O99212 Obesity complicating pregnancy, second trimester: Secondary | ICD-10-CM

## 2018-02-24 ENCOUNTER — Other Ambulatory Visit: Payer: Self-pay

## 2018-02-24 ENCOUNTER — Ambulatory Visit
Admission: RE | Admit: 2018-02-24 | Discharge: 2018-02-24 | Disposition: A | Discharge status: Home / Self Care | Payer: Self-pay | Source: Ambulatory Visit | Attending: Obstetrics & Gynecology | Admitting: Obstetrics & Gynecology

## 2018-02-24 DIAGNOSIS — O99212 Obesity complicating pregnancy, second trimester: Secondary | ICD-10-CM

## 2018-02-24 DIAGNOSIS — Z3A19 19 weeks gestation of pregnancy: Secondary | ICD-10-CM | POA: Insufficient documentation

## 2018-02-24 DIAGNOSIS — O09522 Supervision of elderly multigravida, second trimester: Secondary | ICD-10-CM | POA: Insufficient documentation

## 2018-02-24 NOTE — Progress Notes (Addendum)
Referring Provider:  [facility] Department Length of Consultation: 30 minutes  Ms. Shaunice Levitan was referred to [facility] [place] for genetic counseling because of advanced maternal age.  The patient will be 37 years old at the time of delivery.  This note summarizes the information we discussed.  The patient was counseled by Leatrice Jewels, genetic counseling intern, supervised by Katrina Stack, MS, CGC.  We explained that the chance of a chromosome abnormality increases with maternal age.  Chromosomes and examples of chromosome problems were reviewed.  Humans typically have 46 chromosomes in each cell, with half passed through each sperm and egg.  Any change in the number or structure of chromosomes can increase the risk of problems in the physical and mental development of a pregnancy.   Based upon age of the patient, the chance of any chromosome abnormality was 1 in 30. The chance of Down syndrome, the most common chromosome problem associated with maternal age, was 1 in 200.  The risk of chromosome problems is in addition to the 3% general population risk for birth defects and mental retardation.  The greatest chance, of course, is that the baby would be born in good health.  We discussed the following prenatal screening and testing options for this pregnancy:  Maternal serum marker screening, a blood test that measures pregnancy proteins, can provide risk assessments for Down syndrome, trisomy 18, and open neural tube defects (spina bifida, anencephaly). Because it does not directly examine the fetus, it cannot positively diagnose or rule out these problems.  The patient had this drawn previously at her OB and the results were within normal range.  The chance for Down syndrome was estimated to be 1 in 808.  The chance for trisomy 8 is 1 in 16,900.  The risk for open neural tube defects was estimated to be 1 in 11,800.    Targeted ultrasound uses high  frequency sound waves to create an image of the developing fetus.  An ultrasound is often recommended as a routine means of evaluating the pregnancy.  It is also used to screen for fetal anatomy problems (for example, a heart defect) that might be suggestive of a chromosomal or other abnormality.   Amniocentesis involves the removal of a small amount of amniotic fluid from the sac surrounding the fetus with the use of a thin needle inserted through the maternal abdomen and uterus.  Ultrasound guidance is used throughout the procedure.  Fetal cells from amniotic fluid are directly evaluated and > 99.5% of chromosome problems and > 98% of open neural tube defects can be detected. This procedure is generally performed after the 15th week of pregnancy.  The main risks to this procedure include complications leading to miscarriage in less than 1 in 200 cases (0.5%).  We also reviewed the availability of cell free fetal DNA testing from maternal blood to determine whether or not the baby may have either Down syndrome, trisomy 72, or trisomy 46.  This test utilizes a maternal blood sample and DNA sequencing technology to isolate circulating cell free fetal DNA from maternal plasma.  The fetal DNA can then be analyzed for DNA sequences that are derived from the three most common chromosomes involved in aneuploidy, chromosomes 13, 18, and 21.  If the overall amount of DNA is greater than the expected level for any of these chromosomes, aneuploidy is suspected.  While we do not consider it a replacement for invasive testing and karyotype analysis, a negative result  from this testing would be reassuring, though not a guarantee of a normal chromosome complement for the baby.  An abnormal result is certainly suggestive of an abnormal chromosome complement, though we would still recommend amniocentesis to confirm any findings from this testing.  Cystic Fibrosis and Spinal Muscular Atrophy (SMA) screening were also discussed  with the patient. Both conditions are recessive, which means that both parents must be carriers in order to have a child with the disease.  Cystic fibrosis (CF) is one of the most common genetic conditions in persons of Caucasian ancestry.  This condition occurs in approximately 1 in 2,500 Caucasian persons and results in thickened secretions in the lungs, digestive, and reproductive systems.  For a baby to be at risk for having CF, both of the parents must be carriers for this condition.  Approximately 1 in 82 Caucasian persons is a carrier for CF.  Current carrier testing looks for the most common mutations in the gene for CF and can detect approximately 90% of carriers in the Caucasian population.  This means that the carrier screening can greatly reduce, but cannot eliminate, the chance for an individual to have a child with CF.  If an individual is found to be a carrier for CF, then carrier testing would be available for the partner. As part of [place]'s newborn screening profile, all babies born in the state of [place] will have a two-tier screening process.  Specimens are first tested to determine the concentration of immunoreactive trypsinogen (IRT).  The top 5% of specimens with the highest IRT values then undergo DNA testing using a panel of over 40 common CF mutations. SMA is a neurodegenerative disorder that leads to atrophy of skeletal muscle and overall weakness.  This condition is also more prevalent in the Caucasian population, with 1 in 40-1 in 60 persons being a carrier and 1 in 6,000-1 in 10,000 children being affected.  There are multiple forms of the disease, with some causing death in infancy to other forms with survival into adulthood.  The genetics of SMA is complex, but carrier screening can detect up to 95% of carriers in the Caucasian population.  Similar to CF, a negative result can greatly reduce, but cannot eliminate, the chance to have a child with SMA.  Hemoglobinopathy  carrier screening was also offered.  We obtained a detailed family history and pregnancy history.    Ms. Errin Whitelaw reports that this is her fifth pregnancy and that she has four living children.  She reports that her 56 year old son has anxiety and depression, possibly associated with previous trauma.  She states that she also experiences anxiety.  She reports that she has four siblings, two brothers and two sisters.  The patient reports that she and one of her brothers also have asthma.  She reports that her parents are living and both have hypertension.  She also reports that her father is a former smoker and has COPD.  The current understanding is that conditions such as depression, anxiety, asthma, and hypertension are multifactorial conditions, meaning that there can be influences of genetics and environment.  Environmental contributions can include elements such as lifestyle, diet, and other environmental exposures.  Ms. Tessa Seaberry also reports histories of infertility for one of her brothers and one sister.  She states that her brother has not been able to conceive children with more than one partner, though no evaluation has been pursued.  She also reports that her sister experienced three pregnancy losses  associated with ectopic pregnancy.  Ectopic pregnancies occur when a fertilized egg implants anywhere except the uterus.  There is currently no known genetic component, and risk factors are thought to include: a previous history ectopic pregnancy or pelvic surgery, endometriosis, and certain sexually-transmitted infections, among others.   Lastly, she stated that her mother experienced spontaneous loss of twins at 3 months gestation but went on to have 5 healthy children. There can be multiple causes for infertility as well as pregnancy loss, some of which include genetic causes, though without further information or medical records to review, it is difficult to accurately assess  risks for other family members to have similar pregnancy outcomes.   Ms. Mary-Anne Polizzi also reports that there are two possible biological fathers for this pregnancy.  One of the partners is the father of her other four children.  We discussed the family history of each partner and she was not aware of any history of birth defects, genetic conditions, or personal exposures for either individual.  She inquired about paternity testing and we provided her with the contact information for [facility] for possible testing following delivery. She would not consider invasive prenatal diagnosis for paternity testing.  The remainder of the family history is unremarkable for birth defects, developmental delays, recurrent pregnancy loss or known chromosome abnormalities.  She reports that her family is of [place] heritage and that there is no known Ashkenazi Jewish ancestry.  The two potential fathers are also of [place] heritage, and consanguinity is denied.    Ms. Wai Minotti reports no spotting or bleeding associated with this pregnancy.  She reported no exposure to alcohol, recreational drugs, or tobacco during the pregnancy.  She currently takes prenatal vitamins, vitamin D, as well as anxiety and allergy medication.  At the time of the appointment, she was unsure of the names for the anxiety and allergy medication.  Ms. Faithlyn Recktenwald reports that the prescriber of the anxiety medication was aware of the pregnancy, and she was told that the medication was safe to use during pregnancy.  We would be happy to review if more information is provided at a later date. She also uses an inhaler for asthma as needed.  After consideration of the options, Ms. Tyrianna Lightle elected to proceed with an ultrasound only.  She declined any additional screening or testing for this pregnancy.  An ultrasound was performed at the time of the visit.  The gestational age was consistent with 19 weeks.   No markers of  aneuploidy were noted, but it is important to remember that a normal ultrasound does not exclude the possibility of birth defect or chromosome condition.  Please refer to the ultrasound report for details of that study.  Ms. Marilene Vath was encouraged to call with questions or concerns.  We can be contacted at [phone].   Tests Ordered:  None  Cherly Anderson, MS, CGC  Eldean Klatt, [place] A, MD

## 2018-03-07 ENCOUNTER — Ambulatory Visit: Payer: Self-pay | Admitting: Pharmacy Technician

## 2018-03-10 ENCOUNTER — Other Ambulatory Visit: Payer: Self-pay

## 2018-03-10 ENCOUNTER — Emergency Department
Admission: EM | Admit: 2018-03-10 | Discharge: 2018-03-10 | Disposition: A | Discharge status: Home / Self Care | Payer: Self-pay | Attending: Emergency Medicine | Admitting: Emergency Medicine

## 2018-03-10 ENCOUNTER — Encounter: Payer: Self-pay | Admitting: Emergency Medicine

## 2018-03-10 DIAGNOSIS — Z3A22 22 weeks gestation of pregnancy: Secondary | ICD-10-CM | POA: Insufficient documentation

## 2018-03-10 DIAGNOSIS — O99512 Diseases of the respiratory system complicating pregnancy, second trimester: Secondary | ICD-10-CM | POA: Insufficient documentation

## 2018-03-10 DIAGNOSIS — J4521 Mild intermittent asthma with (acute) exacerbation: Secondary | ICD-10-CM

## 2018-03-10 DIAGNOSIS — Z3490 Encounter for supervision of normal pregnancy, unspecified, unspecified trimester: Secondary | ICD-10-CM

## 2018-03-10 DIAGNOSIS — Z79899 Other long term (current) drug therapy: Secondary | ICD-10-CM | POA: Insufficient documentation

## 2018-03-10 MED ORDER — PREDNISONE 10 MG PO TABS: ORAL_TABLET | ORAL | 0 refills | Status: DC

## 2018-03-10 MED ORDER — ALBUTEROL SULFATE (2.5 MG/3ML) 0.083% IN NEBU
2.5000 mg | INHALATION_SOLUTION | Freq: Once | RESPIRATORY_TRACT | Status: AC
  Administered 2018-03-10: 2.5 mg via RESPIRATORY_TRACT
  Filled 2018-03-10: qty 3

## 2018-03-10 MED ORDER — ALBUTEROL SULFATE (2.5 MG/3ML) 0.083% IN NEBU
5.0000 mg | INHALATION_SOLUTION | Freq: Once | RESPIRATORY_TRACT | Status: AC
  Administered 2018-03-10: 5 mg via RESPIRATORY_TRACT
  Filled 2018-03-10: qty 6

## 2018-03-10 MED ORDER — PREDNISONE 20 MG PO TABS
30.0000 mg | ORAL_TABLET | Freq: Once | ORAL | Status: AC
  Administered 2018-03-10: 30 mg via ORAL
  Filled 2018-03-10: qty 2

## 2018-03-10 NOTE — ED Provider Notes (Signed)
[facility] Emergency Department Provider Note   ____________________________________________   First MD Initiated Contact with Patient 03/10/18 [phone]     (approximate)  I have reviewed the triage vital signs and the nursing notes.   HISTORY  Chief Complaint Cough and Wheezing  HPI Bailey Richardson is a 37 y.o. female presents to the ED with complaint of cough and congestion.  Patient also began wheezing approximately 3 hours prior to arrival.  Patient has a history of asthma and has her inhaler as directed without any relief.  Patient denies any fever or chills.  Patient is 5-1/2 months pregnant with her due date around March 11.  She rates her discomfort as 7 out of 10.   Past Medical History:  Diagnosis Date  . Anxiety   . Asthma   . Depression   . Endometriosis     Patient Active Problem List   Diagnosis Date Noted  . Advanced maternal age in multigravida, second trimester     History reviewed. No pertinent surgical history.  Prior to Admission medications   Medication Sig Start Date End Date Taking? Authorizing Provider  albuterol (PROVENTIL HFA;VENTOLIN HFA) 108 (90 Base) MCG/ACT inhaler Inhale 2 puffs into the lungs every 6 (six) hours as needed for wheezing or shortness of breath.    [provider]  cholecalciferol (VITAMIN D) 1000 units tablet Take 1,000 Units by mouth daily.    [provider]  predniSONE (DELTASONE) 10 MG tablet Take 3 tablets once a day for 4 days beginning Tuesday 03/10/18   Tommi Rumps, PA-C  prenatal vitamin w/FE, FA (PRENATAL 1 + 1) 27-1 MG TABS tablet Take 1 tablet by mouth daily at 12 noon.    [provider]    Allergies Patient has no known allergies.  Family History  Problem Relation Age of Onset  . Hypertension Mother   . Hypertension Father   . Anxiety disorder Son     Social History Social History   Tobacco Use  . Smoking status: Never Smoker  .  Smokeless tobacco: Never Used  Substance Use Topics  . Alcohol use: Not Currently  . Drug use: Never    Review of Systems Constitutional: No fever/chills Eyes: No visual changes. ENT: No sore throat.  Positive nasal congestion. Cardiovascular: Denies chest pain. Respiratory: Denies shortness of breath.  Positive wheezing and productive cough. Gastrointestinal: No abdominal pain.  No nausea, no vomiting.  No diarrhea.  Genitourinary: Negative for dysuria. Musculoskeletal: Negative for muscle aches. Skin: Negative for rash. Neurological: Negative for headaches, focal weakness or numbness. ___________________________________________   PHYSICAL EXAM:  VITAL SIGNS: ED Triage Vitals  Enc Vitals Group     BP 03/10/18 0437 114/69     Pulse Rate 03/10/18 0437 70     Resp 03/10/18 0437 20     Temp 03/10/18 0437 97.6 F (36.4 C)     Temp Source 03/10/18 0437 Oral     SpO2 03/10/18 0437 98 %     Weight 03/10/18 0440 216 lb (98 kg)     Height 03/10/18 0440 5\' 3"  (1.6 m)     Head Circumference --      Peak Flow --      Pain Score 03/10/18 0438 7     Pain Loc --      Pain Edu? --      Excl. in GC? --     Constitutional: Alert and oriented. Well appearing and in no acute distress. Eyes:  Conjunctivae are normal. PERRL. EOMI. Head: Atraumatic. Nose: Moderate congestion/negative rhinnorhea. Mouth/Throat: Mucous membranes are moist.  Oropharynx non-erythematous. Neck: No stridor.   Hematological/Lymphatic/Immunilogical: No cervical lymphadenopathy. Cardiovascular: Normal rate, regular rhythm. Grossly normal heart sounds.  Good peripheral circulation. Respiratory: Normal respiratory effort.  No retractions. Lungs mild expiratory wheeze noted bilaterally with congested cough. Gastrointestinal: Soft and nontender.  Musculoskeletal: Moves upper and lower extremities without any difficulty.  Normal gait was noted. Neurologic:  Normal speech and language. No gross focal neurologic deficits  are appreciated.  Skin:  Skin is warm, dry and intact. No rash noted. Psychiatric: Mood and affect are normal. Speech and behavior are normal.  ____________________________________________   LABS (all labs ordered are listed, but only abnormal results are displayed)  Labs Reviewed - No data to display  PROCEDURES  Procedure(s) performed: None  Procedures  Critical Care performed: No  ____________________________________________   INITIAL IMPRESSION / ASSESSMENT AND PLAN / ED COURSE  As part of my medical decision making, I reviewed the following data within the electronic MEDICAL RECORD NUMBER Notes from prior ED visits and [place] Controlled Substance Database  Patient presents to the ED with complaint of exacerbation of her asthma.  Patient has been using her albuterol inhaler "a lot".  Currently she is 5-1/2 months pregnant.  She denies any issues with her pregnancy.  She denies any fever, chills, nausea or vomiting.  Patient was given an albuterol nebulizer in triage.  Once she was seen she was given prednisone 30 mg p.o. along with nebulizer treatments x2.  Patient was improved prior to discharge.  She will continue with the prednisone 30 mg once daily for the next 4 days and her inhaler.  She is encouraged to follow-up with her and her OB/GYN if any continued problems.  She is aware to return to the emergency department immediately if any severe worsening of her symptoms.  ____________________________________________   FINAL CLINICAL IMPRESSION(S) / ED DIAGNOSES  Final diagnoses:  Mild intermittent asthma with exacerbation  Pregnancy, unspecified gestational age     ED Discharge Orders         Ordered    predniSONE (DELTASONE) 10 MG tablet     03/10/18 1010           Note:  This document was prepared using Dragon voice recognition software and may include unintentional dictation errors.    Tommi Rumps, PA-C 03/10/18 1015    Minna Antis, MD 03/10/18  1445

## 2018-03-10 NOTE — Discharge Instructions (Signed)
Follow-up with your primary care provider in the doctor that you see at the health department for your pregnancy.  Call your OB/GYN to see what they suggest for nasal congestion.  Continue using your albuterol inhaler as needed for your asthma.  Also today you had prednisone.  Your next dose will not be until tomorrow.  Continue taking this for the next 4 days. Return to the emergency department if any severe worsening of your breathing.

## 2018-03-10 NOTE — ED Triage Notes (Addendum)
Pt says about 3 hours ago she started coughing and wheezing; history of asthma; used prescribed inhaler as directed but no relief; productive cough of white sputum; pt adds she is 5 1/2 months pregnant; due 07/16/18

## 2018-03-13 ENCOUNTER — Other Ambulatory Visit: Payer: Self-pay | Admitting: Obstetrics & Gynecology

## 2018-03-13 DIAGNOSIS — IMO0002 Reserved for concepts with insufficient information to code with codable children: Secondary | ICD-10-CM

## 2018-03-13 DIAGNOSIS — Z0489 Encounter for examination and observation for other specified reasons: Secondary | ICD-10-CM

## 2018-03-17 ENCOUNTER — Inpatient Hospital Stay: Admission: RE | Admit: 2018-03-17 | Payer: Self-pay | Source: Ambulatory Visit

## 2018-03-17 NOTE — Progress Notes (Signed)
Completed [facility] application and contract.  Patient agreed to all terms of the [facility] contract.    Patient approved to receive medication assistance at [facility] as long as eligibility criteria continues to be met.    Patient is undocumented.  Some PAP medications will be unavailable.  Provided patient with community resource material based on her particular needs.    QVAR & Ventolin Prescription Applications completed with patient.  Forwarded to Dr. Delight Stare at [facility] for signature.  Upon receipt of signed applications from provider, QVAR Prescription Application will be submitted to [facility] and Ventolin Prescription Application will be submitted to [place].  [place] [facility]

## 2018-04-21 ENCOUNTER — Other Ambulatory Visit: Payer: Self-pay

## 2018-04-21 DIAGNOSIS — O09522 Supervision of elderly multigravida, second trimester: Secondary | ICD-10-CM

## 2018-04-21 LAB — HM HIV SCREENING LAB: HM HIV Screening: NEGATIVE

## 2018-04-21 LAB — OB RESULTS CONSOLE HIV ANTIBODY (ROUTINE TESTING): HIV: NONREACTIVE

## 2018-04-22 LAB — OB RESULTS CONSOLE RPR: RPR: NONREACTIVE

## 2018-04-24 ENCOUNTER — Ambulatory Visit
Admission: RE | Admit: 2018-04-24 | Discharge: 2018-04-24 | Disposition: A | Discharge status: Home / Self Care | Payer: Self-pay | Source: Ambulatory Visit | Attending: Maternal & Fetal Medicine | Admitting: Maternal & Fetal Medicine

## 2018-04-24 DIAGNOSIS — O09522 Supervision of elderly multigravida, second trimester: Secondary | ICD-10-CM | POA: Insufficient documentation

## 2018-04-24 DIAGNOSIS — Z3A28 28 weeks gestation of pregnancy: Secondary | ICD-10-CM | POA: Insufficient documentation

## 2018-05-07 NOTE — L&D Delivery Note (Addendum)
Delivery Note  Date of delivery: 07/09/2018 Estimated Date of Delivery: 07/16/18 Patient's last menstrual period was 11/19/2017. EGA: [redacted]w[redacted]d  First Stage: Induction/augmentation: misoprostol, pitocin Analgesia /Anesthesia intrapartum: none SROM at 0420 with clear fluid  Bailey Richardson presented to L&D with gestational hypertension. She was induced with misoprostol times two doses and augmented with pitocin. Pain managed without medication.    Second Stage: Complete dilation at 0432 Onset of pushing at 0432 FHR second stage: category II Delivery at 0435 on 07/09/2018  She progressed to complete and had a spontaneous vaginal birth of a live female over an intact perineum. The fetal head was delivered in direct OA position with restitution to LOA. One loop of nuchal cord, delivered through. Left nuchal hand. Anterior then posterior shoulders delivered with minimal assistance. Baby placed on mom's abdomen and attended to by transition RN. Cord clamped and cut when pulseless by grandmother of the baby. Cord blood obtained for newborn labs.  Third Stage: Placenta delivered spontaneously intact with 3VC at 0442. Marginal cord insertion.  Placenta disposition: routine disposal Uterine tone firm / bleeding scant IV pitocin given for hemorrhage prophylaxis  1st degree perineal laceration identified, hemostatic Anesthesia for repair: n/a Repair: n/a Est. Blood Loss (mL): 200  Complications: precipitous labor  Mom to postpartum.  Baby to Couplet care / Skin to Skin.  Newborn: Birth Weight: pending  Apgar Scores: 7, 9 Feeding planned: breast and formula   Bailey Richardson, CNM 07/09/2018 4:57 AM

## 2018-05-12 DIAGNOSIS — E669 Obesity, unspecified: Secondary | ICD-10-CM | POA: Insufficient documentation

## 2018-06-16 ENCOUNTER — Other Ambulatory Visit: Payer: Self-pay

## 2018-06-16 DIAGNOSIS — O09522 Supervision of elderly multigravida, second trimester: Secondary | ICD-10-CM

## 2018-06-19 ENCOUNTER — Inpatient Hospital Stay: Admission: RE | Admit: 2018-06-19 | Payer: Self-pay | Source: Ambulatory Visit

## 2018-06-25 LAB — OB RESULTS CONSOLE GC/CHLAMYDIA
Chlamydia: NEGATIVE
Gonorrhea: NEGATIVE

## 2018-06-25 LAB — OB RESULTS CONSOLE GBS: STREP GROUP B AG: NEGATIVE

## 2018-06-26 ENCOUNTER — Other Ambulatory Visit: Payer: Self-pay

## 2018-06-26 ENCOUNTER — Ambulatory Visit: Payer: Self-pay

## 2018-06-26 DIAGNOSIS — O09522 Supervision of elderly multigravida, second trimester: Secondary | ICD-10-CM

## 2018-06-30 ENCOUNTER — Ambulatory Visit
Admission: RE | Admit: 2018-06-30 | Discharge: 2018-06-30 | Disposition: A | Discharge status: Home / Self Care | Payer: Self-pay | Source: Ambulatory Visit | Attending: Maternal & Fetal Medicine | Admitting: Maternal & Fetal Medicine

## 2018-06-30 DIAGNOSIS — Z3A37 37 weeks gestation of pregnancy: Secondary | ICD-10-CM | POA: Insufficient documentation

## 2018-06-30 DIAGNOSIS — O09522 Supervision of elderly multigravida, second trimester: Secondary | ICD-10-CM | POA: Insufficient documentation

## 2018-06-30 DIAGNOSIS — E669 Obesity, unspecified: Secondary | ICD-10-CM | POA: Insufficient documentation

## 2018-07-08 ENCOUNTER — Inpatient Hospital Stay
Admission: EM | Admit: 2018-07-08 | Discharge: 2018-07-10 | DRG: 807 | Disposition: A | Discharge status: Home / Self Care | Payer: Medicaid Other | Attending: Certified Nurse Midwife | Admitting: Certified Nurse Midwife

## 2018-07-08 ENCOUNTER — Encounter: Payer: Self-pay | Admitting: Certified Nurse Midwife

## 2018-07-08 ENCOUNTER — Other Ambulatory Visit: Payer: Self-pay

## 2018-07-08 DIAGNOSIS — F419 Anxiety disorder, unspecified: Secondary | ICD-10-CM | POA: Diagnosis present

## 2018-07-08 DIAGNOSIS — O139 Gestational [pregnancy-induced] hypertension without significant proteinuria, unspecified trimester: Secondary | ICD-10-CM | POA: Diagnosis present

## 2018-07-08 DIAGNOSIS — O99344 Other mental disorders complicating childbirth: Secondary | ICD-10-CM | POA: Diagnosis present

## 2018-07-08 DIAGNOSIS — J45909 Unspecified asthma, uncomplicated: Secondary | ICD-10-CM | POA: Diagnosis present

## 2018-07-08 DIAGNOSIS — O99214 Obesity complicating childbirth: Secondary | ICD-10-CM | POA: Diagnosis present

## 2018-07-08 DIAGNOSIS — O43123 Velamentous insertion of umbilical cord, third trimester: Secondary | ICD-10-CM | POA: Diagnosis present

## 2018-07-08 DIAGNOSIS — Z3A38 38 weeks gestation of pregnancy: Secondary | ICD-10-CM

## 2018-07-08 DIAGNOSIS — O26893 Other specified pregnancy related conditions, third trimester: Secondary | ICD-10-CM | POA: Diagnosis present

## 2018-07-08 DIAGNOSIS — O9952 Diseases of the respiratory system complicating childbirth: Secondary | ICD-10-CM | POA: Diagnosis present

## 2018-07-08 DIAGNOSIS — O134 Gestational [pregnancy-induced] hypertension without significant proteinuria, complicating childbirth: Secondary | ICD-10-CM | POA: Diagnosis present

## 2018-07-08 DIAGNOSIS — E669 Obesity, unspecified: Secondary | ICD-10-CM | POA: Diagnosis present

## 2018-07-08 DIAGNOSIS — F329 Major depressive disorder, single episode, unspecified: Secondary | ICD-10-CM | POA: Diagnosis present

## 2018-07-08 DIAGNOSIS — R51 Headache: Secondary | ICD-10-CM

## 2018-07-08 LAB — COMPREHENSIVE METABOLIC PANEL
ALBUMIN: 2.7 g/dL — AB (ref 3.5–5.0)
ALT: 15 U/L (ref 0–44)
AST: 17 U/L (ref 15–41)
Alkaline Phosphatase: 93 U/L (ref 38–126)
Anion gap: 7 (ref 5–15)
BUN: 14 mg/dL (ref 6–20)
CHLORIDE: 111 mmol/L (ref 98–111)
CO2: 19 mmol/L — AB (ref 22–32)
CREATININE: 0.51 mg/dL (ref 0.44–1.00)
Calcium: 8.4 mg/dL — ABNORMAL LOW (ref 8.9–10.3)
GFR calc Af Amer: >60 mL/min (ref >60)
GLUCOSE: 79 mg/dL (ref 70–99)
POTASSIUM: 3.9 mmol/L (ref 3.5–5.1)
SODIUM: 137 mmol/L (ref 135–145)
Total Bilirubin: 0.3 mg/dL (ref 0.3–1.2)
Total Protein: 6.3 g/dL — ABNORMAL LOW (ref 6.5–8.1)

## 2018-07-08 LAB — TYPE AND SCREEN
ABO/RH(D): O POS
ANTIBODY SCREEN: NEGATIVE

## 2018-07-08 LAB — CBC
HEMATOCRIT: 35.2 % — AB (ref 36.0–46.0)
Hemoglobin: 11.2 g/dL — ABNORMAL LOW (ref 12.0–15.0)
MCH: 25 pg — AB (ref 26.0–34.0)
MCHC: 31.8 g/dL (ref 30.0–36.0)
MCV: 78.6 fL — AB (ref 80.0–100.0)
Platelets: 226 10*3/uL (ref 150–400)
RBC: 4.48 MIL/uL (ref 3.87–5.11)
RDW: 13.9 % (ref 11.5–15.5)
WBC: 7.3 10*3/uL (ref 4.0–10.5)
nRBC: 0 % (ref 0.0–0.2)

## 2018-07-08 LAB — PROTEIN / CREATININE RATIO, URINE
Creatinine, Urine: 210 mg/dL
Protein Creatinine Ratio: 0.29 mg/mg{Cre} — ABNORMAL HIGH (ref 0.00–0.15)
Total Protein, Urine: 61 mg/dL

## 2018-07-08 MED ORDER — MISOPROSTOL 25 MCG QUARTER TABLET
25.0000 ug | ORAL_TABLET | Freq: Once | ORAL | Status: DC
  Filled 2018-07-08: qty 1

## 2018-07-08 MED ORDER — ACETAMINOPHEN 500 MG PO TABS
1000.0000 mg | ORAL_TABLET | Freq: Four times a day (QID) | ORAL | Status: DC | PRN
  Administered 2018-07-08: 1000 mg via ORAL
  Filled 2018-07-08: qty 2

## 2018-07-08 MED ORDER — TERBUTALINE SULFATE 1 MG/ML IJ SOLN: 0.2500 mg | Freq: Once | INTRAMUSCULAR | Status: DC | PRN

## 2018-07-08 MED ORDER — LACTATED RINGERS IV BOLUS
1000.0000 mL | Freq: Once | INTRAVENOUS | Status: AC
  Administered 2018-07-08: 1000 mL via INTRAVENOUS

## 2018-07-08 MED ORDER — OXYTOCIN BOLUS FROM INFUSION
500.0000 mL | Freq: Once | INTRAVENOUS | Status: AC
  Administered 2018-07-09: 500 mL via INTRAVENOUS

## 2018-07-08 MED ORDER — OXYTOCIN 10 UNIT/ML IJ SOLN
INTRAMUSCULAR | Status: AC
  Filled 2018-07-08: qty 1

## 2018-07-08 MED ORDER — BUTORPHANOL TARTRATE 1 MG/ML IJ SOLN: 1.0000 mg | INTRAMUSCULAR | Status: DC | PRN

## 2018-07-08 MED ORDER — BUDESONIDE 0.25 MG/2ML IN SUSP
0.2500 mg | Freq: Two times a day (BID) | RESPIRATORY_TRACT | Status: DC
  Administered 2018-07-09: 0.25 mg via RESPIRATORY_TRACT
  Filled 2018-07-08 (×4): qty 2

## 2018-07-08 MED ORDER — ONDANSETRON HCL 4 MG/2ML IJ SOLN: 4.0000 mg | Freq: Four times a day (QID) | INTRAMUSCULAR | Status: DC | PRN

## 2018-07-08 MED ORDER — DOCUSATE SODIUM 100 MG PO CAPS
100.0000 mg | ORAL_CAPSULE | Freq: Every day | ORAL | Status: DC
  Administered 2018-07-08 – 2018-07-09 (×2): 100 mg via ORAL
  Filled 2018-07-08 (×2): qty 1

## 2018-07-08 MED ORDER — MISOPROSTOL 200 MCG PO TABS
ORAL_TABLET | ORAL | Status: AC
  Filled 2018-07-08: qty 4

## 2018-07-08 MED ORDER — SOD CITRATE-CITRIC ACID 500-334 MG/5ML PO SOLN: 30.0000 mL | ORAL | Status: DC | PRN

## 2018-07-08 MED ORDER — METOCLOPRAMIDE HCL 5 MG/ML IJ SOLN
10.0000 mg | Freq: Once | INTRAMUSCULAR | Status: AC
  Administered 2018-07-08: 10 mg via INTRAVENOUS
  Filled 2018-07-08: qty 2

## 2018-07-08 MED ORDER — ALBUTEROL SULFATE (2.5 MG/3ML) 0.083% IN NEBU: 3.0000 mL | INHALATION_SOLUTION | Freq: Four times a day (QID) | RESPIRATORY_TRACT | Status: DC | PRN

## 2018-07-08 MED ORDER — LACTATED RINGERS IV SOLN: 500.0000 mL | INTRAVENOUS | Status: DC | PRN

## 2018-07-08 MED ORDER — OXYTOCIN 40 UNITS IN NORMAL SALINE INFUSION - SIMPLE MED
2.5000 [IU]/h | INTRAVENOUS | Status: DC
  Administered 2018-07-09: 2.5 [IU]/h via INTRAVENOUS
  Filled 2018-07-08 (×2): qty 1000

## 2018-07-08 MED ORDER — LIDOCAINE HCL (PF) 1 % IJ SOLN
30.0000 mL | INTRAMUSCULAR | Status: DC | PRN
  Filled 2018-07-08: qty 30

## 2018-07-08 MED ORDER — MISOPROSTOL 25 MCG QUARTER TABLET
25.0000 ug | ORAL_TABLET | ORAL | Status: DC | PRN
  Administered 2018-07-08 (×2): 25 ug via VAGINAL
  Filled 2018-07-08 (×2): qty 1

## 2018-07-08 MED ORDER — CALCIUM CARBONATE ANTACID 500 MG PO CHEW: 2.0000 | CHEWABLE_TABLET | ORAL | Status: DC | PRN

## 2018-07-08 MED ORDER — MISOPROSTOL 25 MCG QUARTER TABLET: 25.0000 ug | ORAL_TABLET | ORAL | Status: DC | PRN

## 2018-07-08 MED ORDER — LACTATED RINGERS IV SOLN
INTRAVENOUS | Status: DC
  Administered 2018-07-08: 15:00:00 via INTRAVENOUS

## 2018-07-08 MED ORDER — MONTELUKAST SODIUM 10 MG PO TABS
10.0000 mg | ORAL_TABLET | Freq: Every day | ORAL | Status: DC
  Filled 2018-07-08 (×3): qty 1

## 2018-07-08 MED ORDER — MISOPROSTOL 25 MCG QUARTER TABLET
25.0000 ug | ORAL_TABLET | Freq: Once | ORAL | Status: AC
  Administered 2018-07-08: 25 ug via BUCCAL
  Filled 2018-07-08: qty 1

## 2018-07-08 NOTE — Progress Notes (Signed)
   07/08/18 1300  Clinical Encounter Type  Visited With Patient and family together  Visit Type Initial  AD education was done on the pt. Pt was alert and understanding Pt will reach out to a ch for a further assistance if needed.

## 2018-07-08 NOTE — H&P (Signed)
OB History & Physical   History of Present Illness:  Chief Complaint: borderline BP in office, protein on urine dip, headache  HPI:  Bailey Richardson is a 38 y.o. [id number] female at [redacted]w[redacted]d dated by [redacted]w[redacted]d ultrasound. She presents to L&D for evaluation for preeclampsia after being seen at [facility] this morning. There, her blood pressure was 136/87, 2+ protein on urine dip, and reports of persistent headache for 2 days. No acute shortness of breath, chest pain, epigastric/RUQ pain, or vomiting. She reports persistent nausea throughout her pregnancy, but no acute increase. Her headache is improved here.   She reports:  -active fetal movement -no leakage of fluid -no vaginal bleeding -no contractions  Pregnancy Issues: 1. Uncertain paternity 2. Obesity, current BMI 43, prepregnancy BMI 37.5 3. Elevated 1h OGTT, 3h WNL 4. Chlamydia positive, with both 3w and 66m retests negative 5. HrHPV+ pap smear, needs repeat 01/2019 6. Advanced maternal age 24. Entered prenatal care at 14 weeks 8. Anxiety/depression, previously on 100mg  daily of Zoloft, self-discontinued 05/15/2018 9. Asthma, on QVAR and Singulair 10. History of endometriosis, laparoscopic ablation 2007   Maternal Medical History:   Past Medical History:  Diagnosis Date  . Anxiety   . Asthma   . Depression   . Endometriosis     History reviewed. No pertinent surgical history.  No Known Allergies  Prior to Admission medications   Medication Sig Start Date End Date Taking? Authorizing Provider  albuterol (PROVENTIL HFA;VENTOLIN HFA) 108 (90 Base) MCG/ACT inhaler Inhale 2 puffs into the lungs every 6 (six) hours as needed for wheezing or shortness of breath.   Yes [provider]  beclomethasone (QVAR) 40 MCG/ACT inhaler Inhale 1 puff into the lungs 2 (two) times daily.   Yes [provider]  fluticasone (FLONASE) 50 MCG/ACT nasal spray Place into both nostrils daily.   Yes [provider]   montelukast (SINGULAIR) 10 MG tablet Take 10 mg by mouth at bedtime.   Yes [provider]  prenatal vitamin w/FE, FA (PRENATAL 1 + 1) 27-1 MG TABS tablet Take 1 tablet by mouth daily at 12 noon.   Yes [provider]  cholecalciferol (VITAMIN D) 1000 units tablet Take 1,000 Units by mouth daily.    [provider]     Prenatal care site: [facility]   Social History: She  reports that she has never smoked. She has never used smokeless tobacco. She reports previous alcohol use. She reports that she does not use drugs.  Family History: family history includes Anxiety disorder in her son; Hypertension in her father and mother.   Review of Systems: A full review of systems was performed and negative except as noted in the HPI.    Physical Exam:  Vital Signs: BP 116/67   Pulse 75   Temp 98.1 F (36.7 C) (Oral)   Resp 20   Ht 5\' 3"  (1.6 m)   Wt 109.8 kg   LMP 11/19/2017   BMI 42.87 kg/m   General:   alert, cooperative, appears stated age and no distress  Skin:  normal and no rash or abnormalities  Neurologic:    Alert & oriented x 3  Lungs:   clear to auscultation bilaterally  Heart:   regular rate and rhythm, S1, S2 normal, no murmur, click, rub or gallop  Abdomen:  soft, non-tender; bowel sounds normal; no masses,  no organomegaly  Pelvis:  Exam deferred.  FHT:  140 BPM  Presentations: cephalic  Cervix:  exam per [facility] CNM at appointment this morning   Dilation: 3cm   Effacement: 50%   Station:  -3   Consistency: soft   Position: posterior  Extremities: : non-tender, symmetric, no edema bilaterally.    Results for orders placed or performed during the hospital encounter of 07/08/18 (from the past 24 hour(s))  Type and screen [facility]     Status: None (Preliminary result)   Collection Time: 07/08/18 11:46 AM  Result Value Ref Range   ABO/RH(D) PENDING    Antibody Screen PENDING    Sample Expiration       07/11/2018 Performed at [facility], [address] [place], [place] [address]   Comprehensive metabolic panel     Status: Abnormal   Collection Time: 07/08/18 11:47 AM  Result Value Ref Range   Sodium 137 135 - 145 mmol/L   Potassium 3.9 3.5 - 5.1 mmol/L   Chloride 111 98 - 111 mmol/L   CO2 19 (L) 22 - 32 mmol/L   Glucose, Bld 79 70 - 99 mg/dL   BUN 14 6 - 20 mg/dL   Creatinine, Ser 6.38 0.44 - 1.00 mg/dL   Calcium 8.4 (L) 8.9 - 10.3 mg/dL   Total Protein 6.3 (L) 6.5 - 8.1 g/dL   Albumin 2.7 (L) 3.5 - 5.0 g/dL   AST 17 15 - 41 U/L   ALT 15 0 - 44 U/L   Alkaline Phosphatase 93 38 - 126 U/L   Total Bilirubin 0.3 0.3 - 1.2 mg/dL   GFR calc non Af Amer >60 >60 mL/min   GFR calc Af Amer >60 >60 mL/min   Anion gap 7 5 - 15  CBC on admission     Status: Abnormal   Collection Time: 07/08/18 11:47 AM  Result Value Ref Range   WBC 7.3 4.0 - 10.5 K/uL   RBC 4.48 3.87 - 5.11 MIL/uL   Hemoglobin 11.2 (L) 12.0 - 15.0 g/dL   HCT 45.3 (L) 64.6 - 80.3 %   MCV 78.6 (L) 80.0 - 100.0 fL   MCH 25.0 (L) 26.0 - 34.0 pg   MCHC 31.8 30.0 - 36.0 g/dL   RDW 21.2 24.8 - 25.0 %   Platelets 226 150 - 400 K/uL   nRBC 0.0 0.0 - 0.2 %  Protein / creatinine ratio, urine     Status: Abnormal   Collection Time: 07/08/18 11:47 AM  Result Value Ref Range   Creatinine, Urine 210 mg/dL   Total Protein, Urine 61 mg/dL   Protein Creatinine Ratio 0.29 (H) 0.00 - 0.15 mg/mg[Cre]    Pertinent Results:  Prenatal Labs: Blood type/Rh O+  Antibody screen neg  Rubella Immune  Varicella Immune  RPR NR  HBsAg Neg  HIV NR  GC neg  Chlamydia neg  Genetic screening Quad screen negative  1 hour GTT 150  3 hour GTT 72, 145, 118, 72  GBS negative   FHT: FHR: 145 bpm, variability: moderate,  accelerations:  Present,  decelerations:  Absent Category/reactivity:  Category I TOCO: occasional  [place] Mfm Ob Follow Up  Result Date:  06/30/2018 ----------------------------------------------------------------------  OBSTETRICS REPORT                       (Signed Final 06/30/2018 12:58 pm) ---------------------------------------------------------------------- PATIENT INFO:  ID #:       [id number]  D.O.B.:  11-01-1980 (37 yrs)  Name:       ENYLA LISBON                 Visit Date: 06/30/2018 09:05 am              RODRIGUEZ ---------------------------------------------------------------------- PERFORMED BY:  Performed By:     Ceasar Mons          Ref. Address:     [address]                                                             [place],                                                             [place], [place]                                                             [address]  Referred By:      Alberteen Spindle CNM ---------------------------------------------------------------------- SERVICE(S) PROVIDED:   [place] MFM OB FOLLOW UP                                  [phone]  ---------------------------------------------------------------------- INDICATIONS:   [redacted] weeks gestation of pregnancy                Z3A.37  ---------------------------------------------------------------------- FETAL EVALUATION:  Num Of Fetuses:         1  Fetal Heart Rate(bpm):  132  Cardiac Activity:       Present  Presentation:           Cephalic  Placenta:               Posterior  AFI Sum(cm)     %Tile       Largest Pocket(cm)  14.95           57          4.7  RUQ(cm)       RLQ(cm)       LUQ(cm)        LLQ(cm)  2.2           3.71          4.34           4.7 ---------------------------------------------------------------------- BIOMETRY:  BPD:      92.1  mm     G. Age:  37w 3d         63  %    CI:        75.62   %    70 - 86  FL/HC:      21.8   %    20.9 - 22.7  HC:      335.8  mm     G. Age:  38w 3d         46  %    HC/AC:      1.00         0.92 - 1.05  AC:      335.2  mm     G. Age:  37w 3d         58  %    FL/BPD:     79.5   %    71 - 87  FL:       73.2  mm     G. Age:  37w 4d         45  %    FL/AC:      21.8   %    20 - 24  HUM:      64.7  mm     G. Age:  37w 4d         72  %  Est. FW:    3241  gm      7 lb 2 oz     55  % ---------------------------------------------------------------------- GESTATIONAL AGE:  U/S Today:     37w 5d                                        EDD:   07/16/18  Best:          37w 5d     Det. By:  Marcella Dubs         EDD:   07/16/18                                      (01/21/18) ---------------------------------------------------------------------- ANATOMY:  Cranium:               Limited Views          Stomach:                Seen  Heart:                 4-Chamber view         Kidneys:                Normal appearance                         appears normal  RVOT:                  Seen                   Bladder:                Seen  LVOT:                  Seen                   Spine:                  Limited views  Aortic Arch:           Seen ---------------------------------------------------------------------- CERVIX UTERUS ADNEXA:  Cervix  Suboptimal ----------------------------------------------------------------------  IMPRESSION:  ,  Thank you for referring your patient for a fetal growth  evaluation.  There is a singleton gestation at 37 weeks 5 days.  with  normal amniotic fluid volume.  Dating is earliest available  [facility];  measurements were consistent with 14 weeks 6 days.  The fetal biometry correlates with established dating.  Adequate interval growth noted.  Thank you for allowing us to participate in your patient's care.  Please do not hesitate to contact us if we can be of further  assistance. ----------------------------------------------------------------------                   Consuelo PandyMaria Small, MD Electronically Signed Final Report   06/30/2018 12:58 pm  ----------------------------------------------------------------------    Assessment:  Tana CoastZoraya Gonzalez Rodriguez is a 38 y.o. [id number] female at 108w6d with gestational hypertension.   Plan:  1. Admit to Labor & Delivery; consents reviewed and obtained  2. Fetal Well being  - Fetal Tracing: category I - GBS negative - Presentation: cephalic confirmed by Leopold's   3. Routine OB: - Prenatal labs reviewed, as above - Rh positive - CBC & T&S on admit - Clear fluids, IVF  4. Induction of Labor -  Contractions to be monitored with external toco in place -  Pelvis proven to 8lb 3oz -  Plan for induction with misoprostol -  Plan for continuous fetal monitoring  -  Maternal pain control as desired: IVPM, nitrous, regional anesthesia -  Anticipate vaginal delivery  5. Post Partum Planning: - Infant feeding: breast and formula - Contraception: Paragard vs. BTL   Discussed with Dr. Dalbert GarnetBeasley, who agrees with plan for induction of labor for gestational hypertension at term. P/C ratio just below threshold for criteria for preeclampsia. Plan to repeat CMP, CBC, and P/C ratio tomorrow morning. Dr. Dalbert GarnetBeasley informed and in agreement with this plan.    Genia DelMargaret Mekala Winger, CNM 07/08/2018 2:09 PM ----- Genia DelMargaret Kandise Riehle Certified Nurse Midwife [facility], Department of OB/GYN [facility]

## 2018-07-08 NOTE — Progress Notes (Signed)
Labor Progress Note  Bailey Richardson is a 38 y.o. A5W0981 at [redacted]w[redacted]d by [redacted]w[redacted]d ultrasound admitted for induction of labor due to gestational hypertension.  Subjective:  Patient reports that she was previously feeling cramping, but has not been feeling much recently.   Objective: BP 138/76   Pulse 72   Temp 98 F (36.7 C) (Oral)   Resp 16   Ht 5\' 3"  (1.6 m)   Wt 109.8 kg   LMP 11/19/2017   BMI 42.87 kg/m   Fetal Assessment: FHT:  FHR: 140 bpm, variability: moderate,  accelerations:  Present,  decelerations:  Present variable Category/reactivity:  Category II UC: occasional SVE: per RN Zipporah Plants at [phone] Dilation: 2cm  Effacement: 60%  Station:  -2  Consistency: soft  Position: posterior  Membrane status: intact Amniotic color: n/a  Labs: Lab Results  Component Value Date   WBC 7.3 07/08/2018   HGB 11.2 (L) 07/08/2018   HCT 35.2 (L) 07/08/2018   MCV 78.6 (L) 07/08/2018   PLT 226 07/08/2018    Assessment / Plan: Induction of labor due to gestational hypertension  Labor: s/p two doses of misoprostol, last at 2024, contracting ocasionally Hypertension:  no signs or symptoms of preeclampsia Fetal Wellbeing:  Category II for occasional variable deceleration, overall reassuring Pain Control:  Maternal pain control as desired: IVPM, nitrous, regional anesthesia Anticipated MOD:  NSVD  Genia Del, CNM 07/08/2018, 11:26 PM

## 2018-07-08 NOTE — OB Triage Note (Signed)
Patient sent over from [facility] after prenatal visit for elevated blood pressures and patient complaints of a headache that has been going on for about a week.

## 2018-07-08 NOTE — Progress Notes (Signed)
Tracing Note  Bailey Richardson is a 38 y.o. [id number] at [redacted]w[redacted]d by [redacted]w[redacted]d ultrasound admitted for induction of labor due to gestational hypertension.  Subjective:  Per nursing, patient is starting to feel some cramping, but is overall still comfortable.   Objective: BP 120/72   Pulse 71   Temp 98.5 F (36.9 C) (Oral)   Resp 20   Ht 5\' 3"  (1.6 m)   Wt 109.8 kg   LMP 11/19/2017   BMI 42.87 kg/m   Fetal Assessment: FHT:  FHR: 140 bpm, variability: moderate,  accelerations:  Present,  decelerations:  Absent Category/reactivity:  Category I UC: occasional SVE: per RN Deneise Lever at 1505 Dilation: 1.5cm  Effacement: 50%  Station:  -3  Consistency: soft  Position: posterior  Membrane status: intact Amniotic color: n/a  Labs: Lab Results  Component Value Date   WBC 7.3 07/08/2018   HGB 11.2 (L) 07/08/2018   HCT 35.2 (L) 07/08/2018   MCV 78.6 (L) 07/08/2018   PLT 226 07/08/2018    Assessment / Plan: Induction of labor due to gestational hypertension  Labor: s/p one dose of misoprostol at 1515, contracting occasionally Hypertension:  no signs or symptoms of preeclampsia Fetal Wellbeing:  Category I Pain Control:  Maternal pain control as desired: IVPM, nitrous, regional anesthesia Anticipated MOD:  NSVD  Genia Del, CNM 07/08/2018, 5:04 PM

## 2018-07-09 ENCOUNTER — Encounter: Payer: Self-pay | Admitting: *Deleted

## 2018-07-09 LAB — RPR: RPR Ser Ql: NONREACTIVE

## 2018-07-09 MED ORDER — ACETAMINOPHEN 325 MG PO TABS
ORAL_TABLET | ORAL | Status: AC
  Administered 2018-07-09: 16:00:00
  Filled 2018-07-09: qty 2

## 2018-07-09 MED ORDER — ONDANSETRON HCL 4 MG PO TABS: 4.0000 mg | ORAL_TABLET | ORAL | Status: DC | PRN

## 2018-07-09 MED ORDER — ONDANSETRON HCL 4 MG/2ML IJ SOLN: 4.0000 mg | INTRAMUSCULAR | Status: DC | PRN

## 2018-07-09 MED ORDER — SIMETHICONE 80 MG PO CHEW: 160.0000 mg | CHEWABLE_TABLET | ORAL | Status: DC | PRN

## 2018-07-09 MED ORDER — PRENATAL PLUS 27-1 MG PO TABS: 1.0000 | ORAL_TABLET | Freq: Every day | ORAL | Status: DC

## 2018-07-09 MED ORDER — DIPHENHYDRAMINE HCL 25 MG PO CAPS: 25.0000 mg | ORAL_CAPSULE | Freq: Four times a day (QID) | ORAL | Status: DC | PRN

## 2018-07-09 MED ORDER — PRENATAL MULTIVITAMIN CH
1.0000 | ORAL_TABLET | Freq: Every day | ORAL | Status: DC
  Administered 2018-07-09 – 2018-07-10 (×2): 1 via ORAL
  Filled 2018-07-09 (×2): qty 1

## 2018-07-09 MED ORDER — DIBUCAINE 1 % RE OINT: 1.0000 "application " | TOPICAL_OINTMENT | RECTAL | Status: DC | PRN

## 2018-07-09 MED ORDER — COCONUT OIL OIL
1.0000 "application " | TOPICAL_OIL | Status: DC | PRN
  Filled 2018-07-09: qty 120

## 2018-07-09 MED ORDER — IBUPROFEN 600 MG PO TABS
ORAL_TABLET | ORAL | Status: AC
  Filled 2018-07-09: qty 1

## 2018-07-09 MED ORDER — IBUPROFEN 600 MG PO TABS
600.0000 mg | ORAL_TABLET | Freq: Four times a day (QID) | ORAL | Status: DC
  Administered 2018-07-09 – 2018-07-10 (×3): 600 mg via ORAL
  Filled 2018-07-09 (×3): qty 1

## 2018-07-09 MED ORDER — ACETAMINOPHEN 325 MG PO TABS
650.0000 mg | ORAL_TABLET | ORAL | Status: DC | PRN
  Administered 2018-07-09 (×2): 650 mg via ORAL
  Filled 2018-07-09: qty 2

## 2018-07-09 MED ORDER — BENZOCAINE-MENTHOL 20-0.5 % EX AERO: 1.0000 "application " | INHALATION_SPRAY | CUTANEOUS | Status: DC | PRN

## 2018-07-09 MED ORDER — OXYTOCIN 40 UNITS IN NORMAL SALINE INFUSION - SIMPLE MED
1.0000 m[IU]/min | INTRAVENOUS | Status: DC
  Administered 2018-07-09: 2 m[IU]/min via INTRAVENOUS

## 2018-07-09 MED ORDER — WITCH HAZEL-GLYCERIN EX PADS: 1.0000 "application " | MEDICATED_PAD | CUTANEOUS | Status: DC | PRN

## 2018-07-09 MED ORDER — SENNOSIDES-DOCUSATE SODIUM 8.6-50 MG PO TABS
2.0000 | ORAL_TABLET | ORAL | Status: DC
  Administered 2018-07-09: 2 via ORAL
  Filled 2018-07-09: qty 2

## 2018-07-09 NOTE — Discharge Summary (Signed)
Obstetric Discharge Summary   Patient Name: Bailey Richardson DOB: 11/13/1980 MRN: [id number]  Date of Admission: 07/08/2018 Date of Delivery: 07/09/2018 Delivered by: Genia Del, CNM Date of Discharge: 07/10/2018  Primary OB:  [facility]  OVZ:CHYIFOY'D last menstrual period was 11/19/2017. EDC Estimated Date of Delivery: 07/16/18 Gestational Age at Delivery: [redacted]w[redacted]d   Antepartum complications:  1. Uncertain paternity 2. Obesity, current BMI 43, prepregnancy BMI 37.5 3. Elevated 1h OGTT, 3h WNL 4. Chlamydia positive, with both 3w and 61m retests negative 5. HrHPV+ pap smear, needs repeat 01/2019 6. Advanced maternal age 38. Entered prenatal care at 14 weeks 8. Anxiety/depression, previously on 100mg  daily of Zoloft, self-discontinued 05/15/2018 9. Asthma, on QVAR and Singulair 10. History of endometriosis, laparoscopic ablation 2007  Admitting Diagnosis:  -induction of labor for gestational hypertension  Secondary Diagnoses: Patient Active Problem List   Diagnosis Date Noted  . Headache in pregnancy, antepartum, third trimester 07/08/2018  . Gestational hypertension 07/08/2018  . Advanced maternal age in multigravida, second trimester     Induction/Augmentation: Pitocin and Cytotec  Complications: None Intrapartum complications/course: Jaclyn Hayre presented to L&D with gestational hypertension. She was induced with misoprostol times two doses and augmented with pitocin. Pain managed without medication. She very quickly progressed to complete and had a spontaneous vaginal birth of a live female over an intact perineum. The fetal head was delivered in direct OA position with restitution to LOA. One loop of nuchal cord, delivered through. Left nuchal hand. Anterior then posterior shoulders delivered with minimal assistance. Baby placed on mom's abdomen and attended to by transition RN. Cord clamped and cut when pulseless by grandmother of the baby. Cord blood obtained for  newborn labs. Placenta delivered spontaneously intact with 3VC at 0442. Marginal cord insertion. Uterine tone firm, bleeding scant. IV pitocin given for hemorrhage prophylaxis. 1st degree perineal laceration identified, hemostatic.   Delivery Type: spontaneous vaginal delivery Anesthesia: none Placenta: spontaneous Laceration: 1st degree perineal, hemostagic Episiotomy: none  Newborn Data: Live born female Birth Weight: 3460  APGAR: 7, 9  Newborn Delivery   Birth date/time:  07/09/2018 04:35:00 Delivery type:  Vaginal, Spontaneous     Postpartum Course  Patient had an uncomplicated postpartum course.  By time of discharge on PPD#1, her pain was controlled on oral pain medications; she had appropriate lochia and was ambulating, voiding without difficulty and tolerating regular diet.  She was deemed stable for discharge to home.       Labs: CBC Latest Ref Rng & Units 07/10/2018 07/08/2018 08/01/2015  WBC 4.0 - 10.5 K/uL 9.4 7.3 6.7  Hemoglobin 12.0 - 15.0 g/dL 10.4(L) 11.2(L) 13.0  Hematocrit 36.0 - 46.0 % 33.8(L) 35.2(L) 39.3  Platelets 150 - 400 K/uL 226 226 229   O POS  Physical exam:  BP 127/77 (BP Location: Left Arm)   Pulse 73   Temp 98.2 F (36.8 C) (Oral)   Resp 20   Ht 5\' 3"  (1.6 m)   Wt 109.8 kg   LMP 11/19/2017   SpO2 98%   Breastfeeding Unknown   BMI 42.87 kg/m  General: alert and no distress Pulm: normal respiratory effort Lochia: appropriate Abdomen: soft, NT Uterine Fundus: firm, below umbilicus Extremities: No evidence of DVT seen on physical exam. No lower extremity edema.   Disposition: stable, discharge to home Baby Feeding: breastmilk & formula Baby Disposition: home with mom  Prenatal Labs:  Blood type/Rh O+  Antibody screen neg  Rubella Immune  Varicella Immune  RPR NR  HBsAg Neg  HIV NR  GC neg  Chlamydia neg  Genetic screening Quad screen negative  1 hour GTT 150  3 hour GTT 72, 145, 118, 72  GBS negative   Rh Immune globulin  given: n/a Rubella vaccine given: n/a Tdap vaccine given in AP or PP setting: AP 04/21/2018 Flu vaccine given in AP or PP setting: declined AP 02/26/2018  Plan:  Bailey Richardson was discharged to home in good condition. [facility] appointment at [facility] with delivery provider in 6 weeks  Discharge Instructions: Per After Visit Summary. Activity: Advance as tolerated. Pelvic rest for 6 weeks.   Diet: Regular Discharge Medications: Allergies as of 07/10/2018   No Known Allergies     Medication List    TAKE these medications   albuterol 108 (90 Base) MCG/ACT inhaler Commonly known as:  PROVENTIL HFA;VENTOLIN HFA Inhale 2 puffs into the lungs every 6 (six) hours as needed for wheezing or shortness of breath.   beclomethasone 40 MCG/ACT inhaler Commonly known as:  QVAR Inhale 1 puff into the lungs 2 (two) times daily.   cholecalciferol 1000 units tablet Commonly known as:  VITAMIN D Take 1,000 Units by mouth daily.   docusate sodium 100 MG capsule Commonly known as:  COLACE Take 1 capsule (100 mg total) by mouth 2 (two) times daily for 14 days. To keep stools soft, as needed   ferrous sulfate 325 (65 FE) MG tablet Take 1 tablet (325 mg total) by mouth daily with breakfast. Take with Vitamin C   fluticasone 50 MCG/ACT nasal spray Commonly known as:  FLONASE Place into both nostrils daily.   ibuprofen 800 MG tablet Commonly known as:  ADVIL,MOTRIN Take 1 tablet (800 mg total) by mouth every 8 (eight) hours as needed for moderate pain or cramping.   montelukast 10 MG tablet Commonly known as:  SINGULAIR Take 10 mg by mouth at bedtime.   prenatal vitamin w/FE, FA 27-1 MG Tabs tablet Take 1 tablet by mouth daily at 12 noon.      Outpatient follow up:  Follow-up Information    Genia Del, CNM. Schedule an appointment as soon as possible for a visit in 6 week(s).   Specialty:  Certified Nurse Midwife Why:  For routine postpartum  visit. Contact information: [address] [place] [address] [phone]            Signed: Christeen Douglas 07/10/18

## 2018-07-09 NOTE — Lactation Note (Addendum)
This note was copied from a baby's chart. Lactation Consultation Note  Patient Name: Bailey Richardson WGNFA'O Date: 07/09/2018 Reason for consult: Initial assessment   Maternal Data    Feeding Feeding Type: Breast Fed  LATCH Score Latch: Grasps breast easily, tongue down, lips flanged, rhythmical sucking.  Audible Swallowing: Spontaneous and intermittent  Type of Nipple: Inverted(inverted right nipple)  Comfort (Breast/Nipple): Soft / non-tender  Hold (Positioning): Assistance needed to correctly position infant at breast and maintain latch.  LATCH Score: 7  Interventions Interventions: Assisted with latch;Skin to skin;Adjust position;Support pillows  Lactation Tools Discussed/Used     Consult Status  Mother states that she tried to latch infant but infant is too sleepy. Diaper was changed and clothes removed. Infant started to wake up and was placed skin to skin with mother. Infant latched on to the right nipple. LC pulled down chin for a deeper latch which was more comfortable for mom. Mother has an inverted right nipple but infant was able to latch on to the right side with assistance.    Arlyss Gandy 07/09/2018, 12:40 PM

## 2018-07-09 NOTE — Discharge Instructions (Signed)

## 2018-07-10 LAB — CBC
HCT: 33.8 % — ABNORMAL LOW (ref 36.0–46.0)
Hemoglobin: 10.4 g/dL — ABNORMAL LOW (ref 12.0–15.0)
MCH: 24.8 pg — ABNORMAL LOW (ref 26.0–34.0)
MCHC: 30.8 g/dL (ref 30.0–36.0)
MCV: 80.7 fL (ref 80.0–100.0)
PLATELETS: 226 10*3/uL (ref 150–400)
RBC: 4.19 MIL/uL (ref 3.87–5.11)
RDW: 14.2 % (ref 11.5–15.5)
WBC: 9.4 10*3/uL (ref 4.0–10.5)
nRBC: 0 % (ref 0.0–0.2)

## 2018-07-10 MED ORDER — DOCUSATE SODIUM 100 MG PO CAPS: 100.0000 mg | ORAL_CAPSULE | Freq: Two times a day (BID) | ORAL | 0 refills | Status: AC

## 2018-07-10 MED ORDER — FERROUS SULFATE 325 (65 FE) MG PO TABS: 325.0000 mg | ORAL_TABLET | Freq: Every day | ORAL | 1 refills | Status: AC

## 2018-07-10 MED ORDER — IBUPROFEN 800 MG PO TABS: 800.0000 mg | ORAL_TABLET | Freq: Three times a day (TID) | ORAL | 1 refills | Status: AC | PRN

## 2018-07-10 NOTE — Progress Notes (Signed)
Pt discharged with infant.  Discharge instructions, prescriptions and follow up appointment given to and reviewed with pt. Pt verbalized understanding. Escorted out by auxillary. 

## 2018-09-17 ENCOUNTER — Telehealth: Payer: Self-pay | Admitting: Pharmacist

## 2018-09-17 NOTE — Telephone Encounter (Signed)
09/17/2018 1:45:32 PM - Ventolin HFA refill  09/17/2018 I have placed a refill online with [facility] for Ventolin HFA, to ship 10/01/2018, order# [id number].Forde Radon

## 2019-03-06 ENCOUNTER — Telehealth: Payer: Self-pay

## 2019-03-06 NOTE — Telephone Encounter (Signed)
TC with patient. Informed needs pap. Patient requests BC also, would like Paragard IUD. Consult completed. Appt scheduled 03/11/19 Aileen Fass, RN

## 2019-03-10 DIAGNOSIS — R87619 Unspecified abnormal cytological findings in specimens from cervix uteri: Secondary | ICD-10-CM

## 2019-03-10 DIAGNOSIS — E669 Obesity, unspecified: Secondary | ICD-10-CM

## 2019-03-10 DIAGNOSIS — F419 Anxiety disorder, unspecified: Secondary | ICD-10-CM | POA: Insufficient documentation

## 2019-03-10 DIAGNOSIS — J4540 Moderate persistent asthma, uncomplicated: Secondary | ICD-10-CM | POA: Insufficient documentation

## 2019-03-11 ENCOUNTER — Other Ambulatory Visit: Payer: Self-pay

## 2019-03-11 ENCOUNTER — Ambulatory Visit: Payer: Self-pay

## 2019-03-11 ENCOUNTER — Ambulatory Visit (LOCAL_COMMUNITY_HEALTH_CENTER): Payer: Self-pay | Admitting: Family Medicine

## 2019-03-11 ENCOUNTER — Encounter: Payer: Self-pay | Admitting: Family Medicine

## 2019-03-11 VITALS — BP 127/84 | Ht 63.0 in | Wt 228.0 lb

## 2019-03-11 DIAGNOSIS — Z3009 Encounter for other general counseling and advice on contraception: Secondary | ICD-10-CM

## 2019-03-11 DIAGNOSIS — A749 Chlamydial infection, unspecified: Secondary | ICD-10-CM

## 2019-03-11 DIAGNOSIS — Z30430 Encounter for insertion of intrauterine contraceptive device: Secondary | ICD-10-CM

## 2019-03-11 LAB — PREGNANCY, URINE: Preg Test, Ur: NEGATIVE

## 2019-03-11 MED ORDER — AZITHROMYCIN 500 MG PO TABS
1000.0000 mg | ORAL_TABLET | Freq: Once | ORAL | Status: AC
  Administered 2019-03-11: 1000 mg via ORAL

## 2019-03-11 MED ORDER — PARAGARD INTRAUTERINE COPPER IU IUD
1.0000 | INTRAUTERINE_SYSTEM | Freq: Once | INTRAUTERINE | Status: AC
  Administered 2019-03-11: 1 via INTRAUTERINE

## 2019-03-11 NOTE — Progress Notes (Signed)
UPT negative today. RN counseling for IUD insertion completed and consent forms signed. Provider orders completed.Ronny Bacon, RN

## 2019-03-11 NOTE — Progress Notes (Signed)
Pt here for pap smear and Paragard IUD insertion. Pt reports she had her period around 02/16/2019 but has only had her period twice since having her baby 07/09/2018. Pt reports last sex was in 11/2018 without condoms. Pt reports that she was treated for Chlamydia while she was pregnant (02/06/2018 per [facility] records). Pt reports her partner never got treated and that they had sex 11/2018.Marland KitchenRonny Bacon, RN
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
## 2019-03-11 NOTE — Progress Notes (Signed)
Patient re-treated for Chlamydia per C. Marzetta Board FNP VO Aileen Fass, RN

## 2019-03-18 LAB — IGP, APTIMA HPV
HPV Aptima: NEGATIVE
PAP Smear Comment: 0

## 2019-04-24 ENCOUNTER — Encounter: Payer: Self-pay | Admitting: Physician Assistant

## 2019-04-24 NOTE — Progress Notes (Addendum)
Patient had pap done 03/11/2019.  Pap NIL/HPV negative.  Recommend repeat cotesting in 1 years, 03/2020.

## 2019-04-26 NOTE — Progress Notes (Signed)
Pap card mailed. Next pap in 1 year per [place]i PA. Aileen Fass, RN

## 2019-04-29 ENCOUNTER — Telehealth: Payer: Self-pay | Admitting: Pharmacy Technician

## 2019-04-29 NOTE — Telephone Encounter (Signed)
Patient failed to provide requested 2020 financial documentation.  No additional medication assistance will be provided by [facility] without the required proof of income documentation.  Patient notified by letter.  Eris Hannan J. Tashiana Lamarca Care Manager [facility] 

## 2019-08-10 IMAGING — US US MFM OB DETAIL+14 WK
1 series · 12 of 28 positions shown · non-contrast
Comparison: none

PATIENT INFO:

RODRIGUEZ
PERFORMED BY:
Sonographer
SERVICE(S) PROVIDED:
INDICATIONS:
17 weeks gestation of pregnancy
Advanced maternal age
Anatomy
Obesity (BMI 37)
FETAL EVALUATION:
Num Of Fetuses:         1
Fetal Heart Rate(bpm):  149
Cardiac Activity:       Present
Presentation:           Variable
Placenta:               Posterior Grade No previa
Amniotic Fluid
AFI FV:      Within normal limits
AFI Sum(cm)     %Tile       Largest Pocket(cm)
10.8            27
BIOMETRY:
BPD:      36.3  mm     G. Age:  17w 1d         24  %    CI:        67.92   %    70 - 86
FL/HC:      20.4   %    15.8 - 18
HC:      140.9  mm     G. Age:  17w 3d         26  %
FL/BPD:     79.1   %
FL:       28.7  mm     G. Age:  18w 6d         82  %
HUM:      27.7  mm     G. Age:  18w 6d         87  %
GESTATIONAL AGE:
U/S Today:     17w 6d                                        EDD:   07/15/18
Best:          17w 5d     Det. By:  Early Ultrasound         EDD:   07/16/18
(01/21/18)
ANATOMY:
Cavum:                 Suboptimal             Diaphragm:              Within Normal Limits
Ventricles:            Normal appearance      Stomach:                Seen
Posterior Fossa:       Within Normal Limits   Abdominal Wall:         Normal appearance
Face:                  Suboptimal             Cord Vessels:           3 vessels
Lips:                  Suboptimal             Kidneys:                Normal appearance
Heart:                 4-Chamber view         Bladder:                Seen
appears normal
RVOT:                  Normal appearance      Spine:                  Suptimal
LVOT:                  Normal appearance      Upper Extremities:      Visualized
Aortic Arch:           Suboptimal             Lower Extremities:      Visualized
Other:  Profile, bicaval views suboptimal; Ao/Pa and 3 vessel trachea appear
normal
CERVIX UTERUS ADNEXA:
Cervix
Length:            4.2  cm.
Adnexa
Normal appearance

[Series 1: us mfm ob detail+14 wk · 0.25mm/px · 12 of 72 slices shown]
[im 3/72]
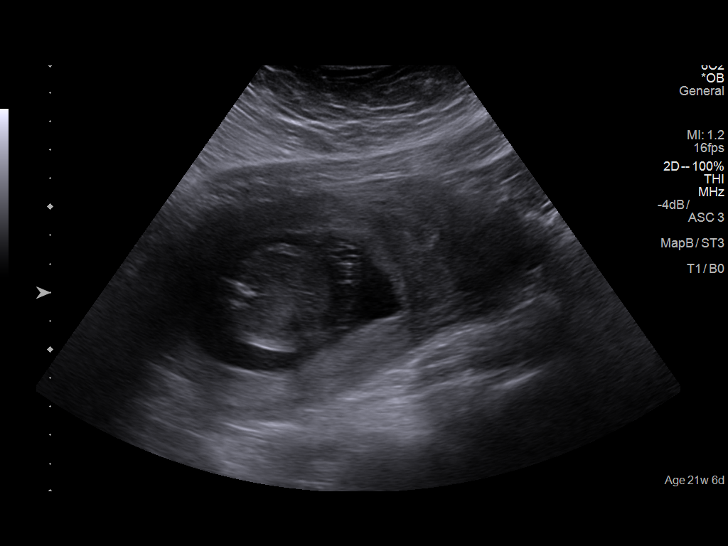
[im 8/72]
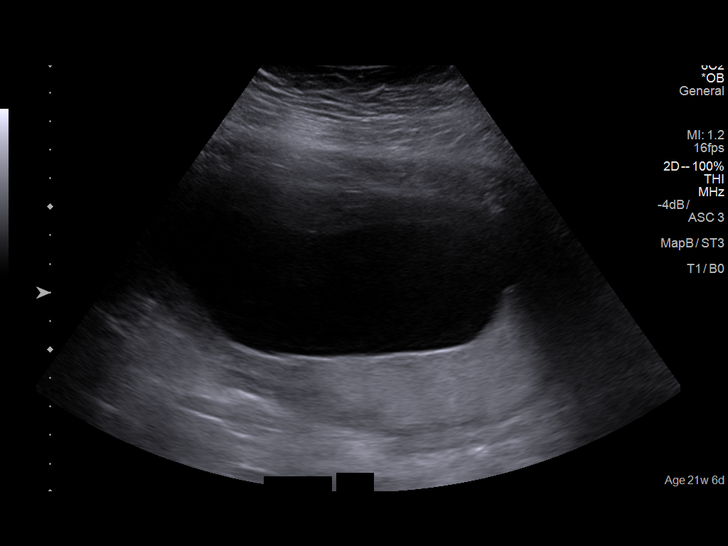
[im 14/72]
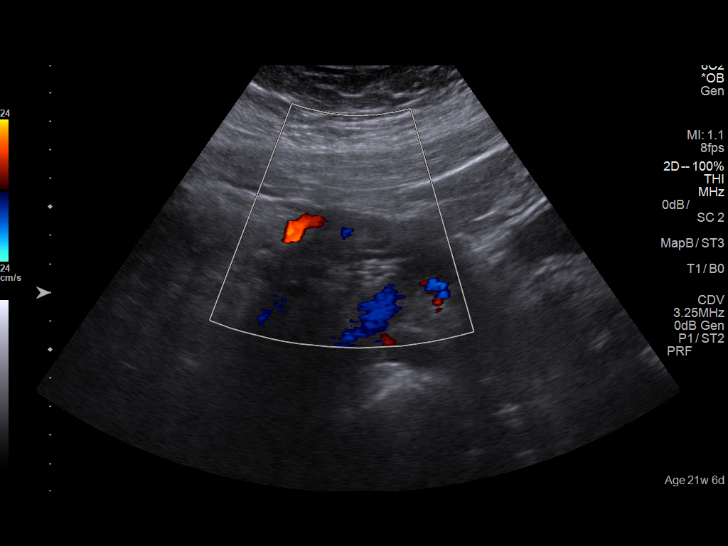
[im 22/72]
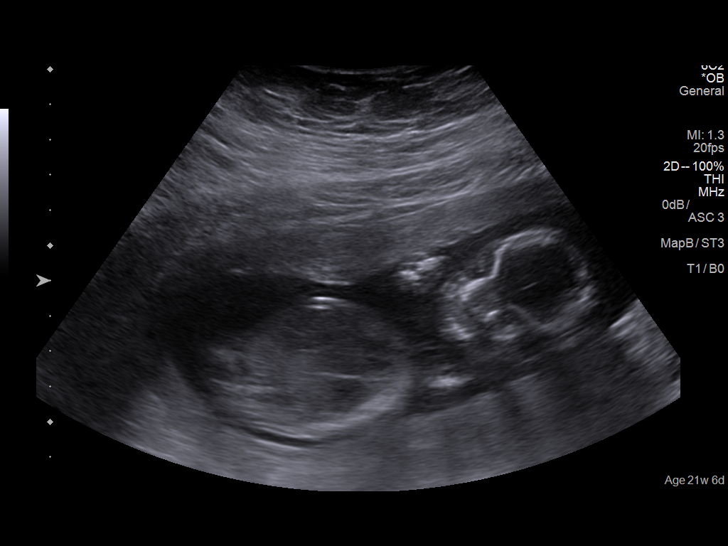
[im 27/72]
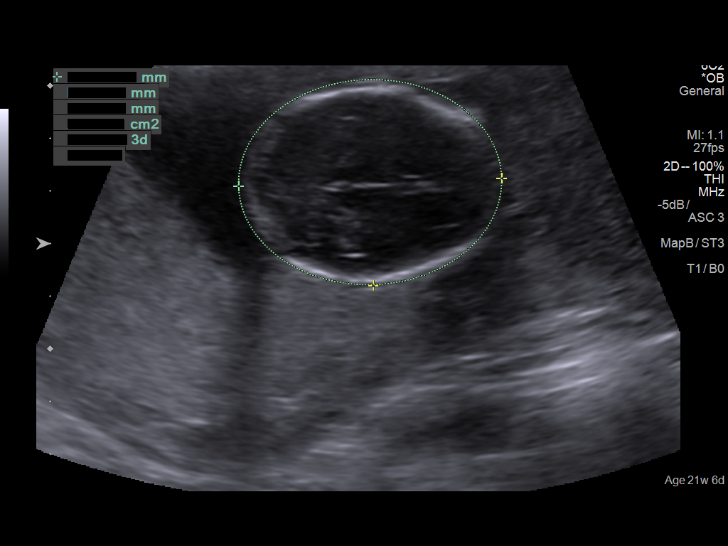
[im 32/72]
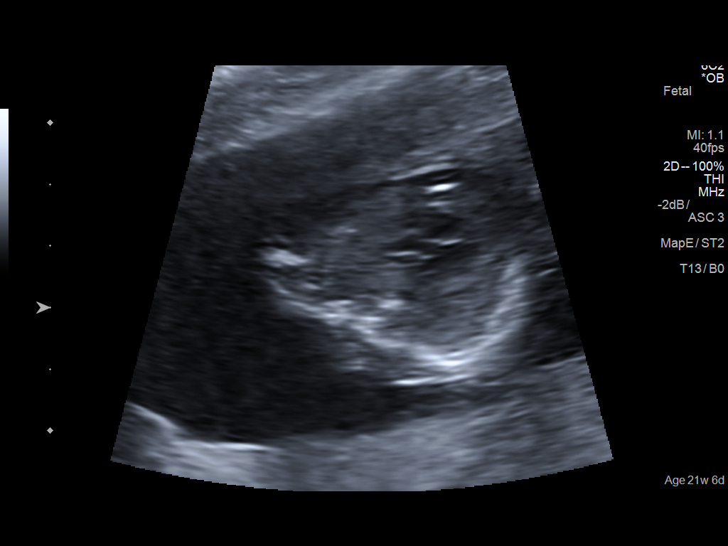
[im 40/72]
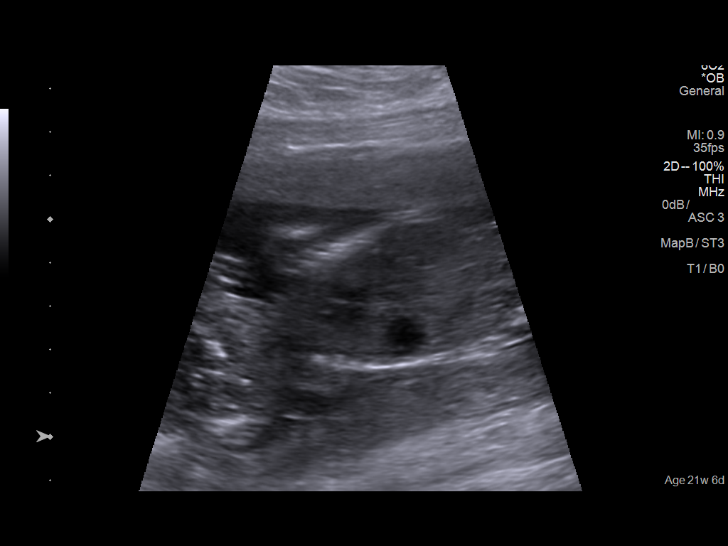
[im 45/72]
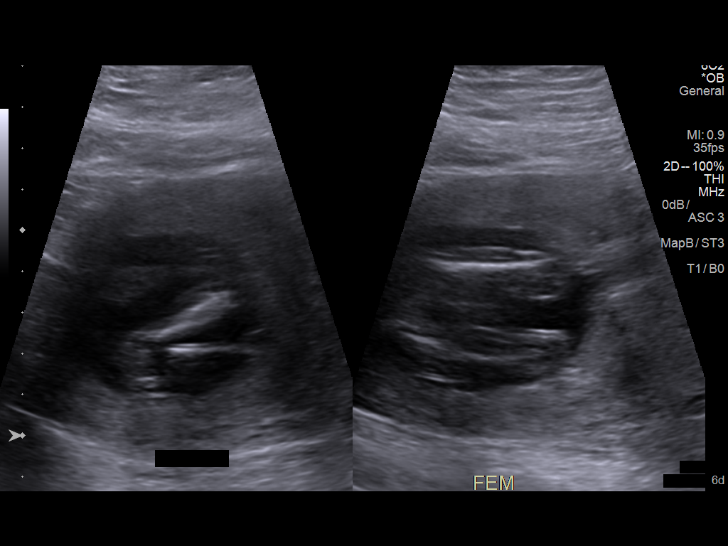
[im 50/72]
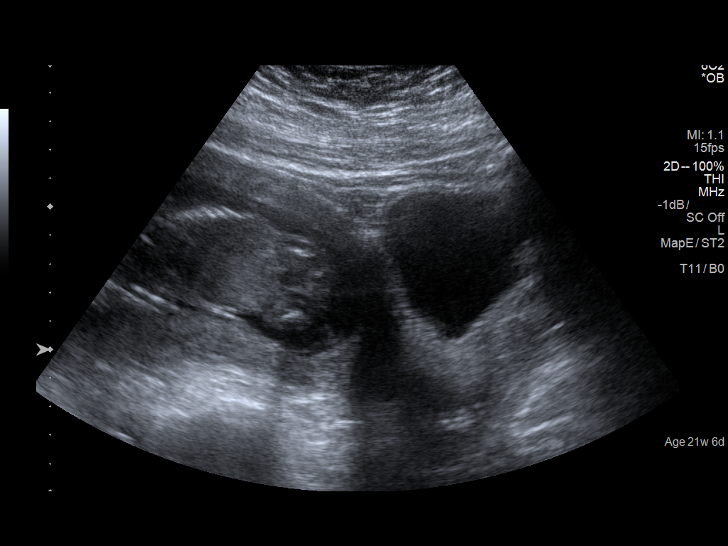
[im 58/72]
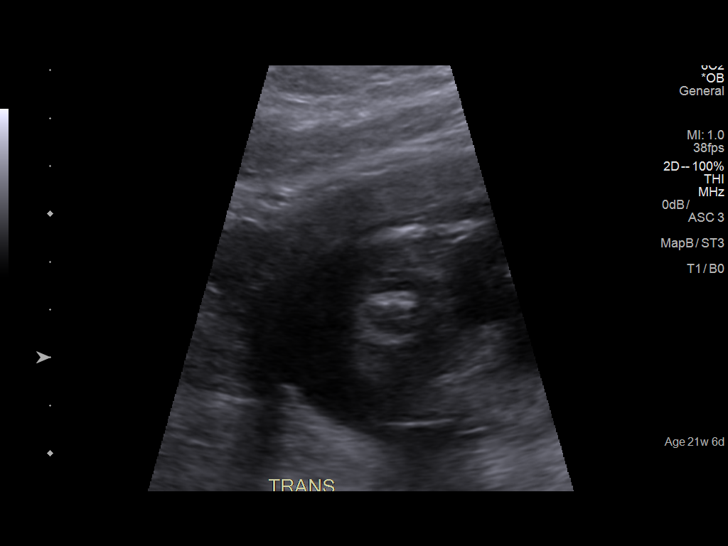
[im 64/72]
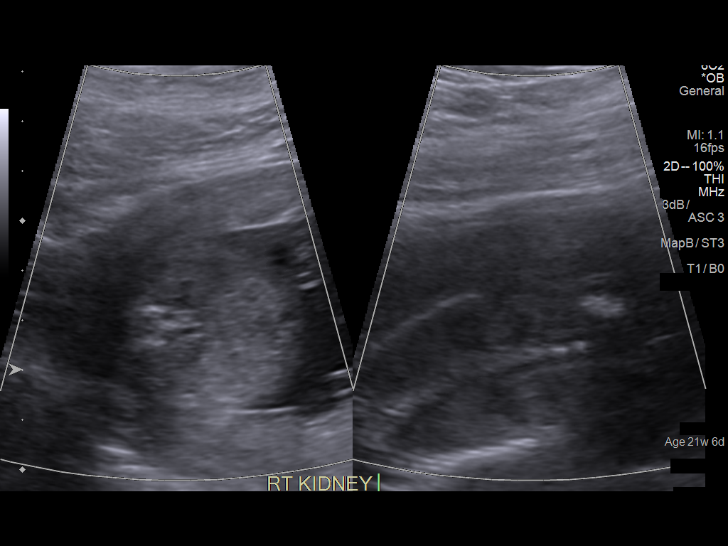
[im 69/72]
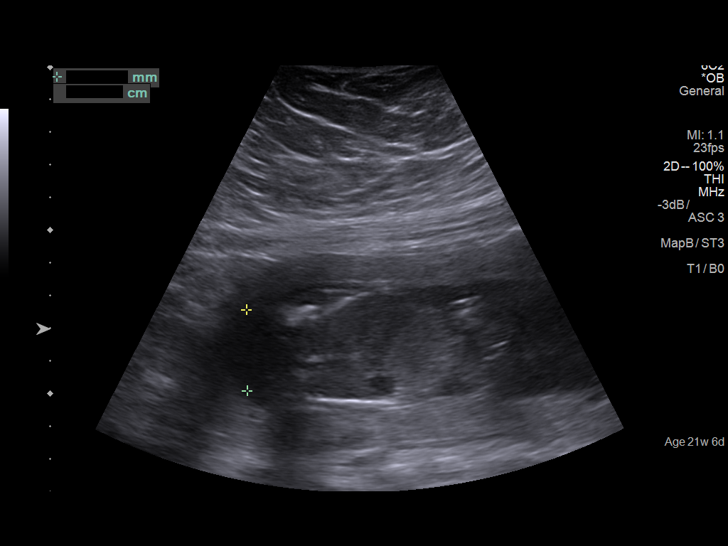

[12 of 28 positions shown; findings below may reference images not displayed]

IMPRESSION: Single intrauterine pregnancy with a best estimated
gestational age of 17 weeks 5 days.  Dating is based on
earliest available ultrasound performed at [facility] on 01/21/2018; measurements were reported as
14 weeks 6 days.

Detailed ultrasound performed for the indication of obesity
and advanced gestational age.

Due to poor maternal acoustics, views of the profile, orbits,
nose/lip, cavum septi pellucidi, spine, distal extremity
position, bicaval views and aortic arch were suboptimally
visualized.  Four chamber heart appears grossly normal.
Other fetal anatomy visualized appears normal within the
limitations of ultrasound.

Maternal ovaries were not visualized; no adnexal masses
seen.

Ms. Rakhi Diosdado was unable to stay for genetic
counseling today; this will be rescheduled at the time of follow
up ultrasound in 2 weeks to complete the fetal anatomy.

Of note,  maternal serum screen was drawn at the [facility]
Department (using LMP dates); will contact [HOSPITAL] with
corrected daing information.  Results are still pending.

## 2019-12-28 IMAGING — US US MFM OB FOLLOW-UP
1 series · 13 of 28 positions shown · non-contrast
Comparison: none

PATIENT INFO:

             RODRIGUEZ
PERFORMED BY:
                   Sonographer
                   JIM CNM
SERVICE(S) PROVIDED:
 ----------------------------------------------------------------------
INDICATIONS:
  37 weeks gestation of pregnancy
FETAL EVALUATION:
 Num Of Fetuses:         1
 Fetal Heart Rate(bpm):  132
 Cardiac Activity:       Present
 Presentation:           Cephalic
 Placenta:               Posterior
 AFI Sum(cm)     %Tile       Largest Pocket(cm)
 14.95           57
 RUQ(cm)       RLQ(cm)       LUQ(cm)        LLQ(cm)
BIOMETRY:
 BPD:      92.1  mm     G. Age:  37w 3d         63  %    CI:        75.62   %    70 - 86
                                                         FL/HC:      21.8   %    20.9 -
 HC:      335.8  mm     G. Age:  38w 3d         46  %    HC/AC:      1.00        0.92 -
 AC:      335.2  mm     G. Age:  37w 3d         58  %    FL/BPD:     79.5   %    71 - 87
 FL:       73.2  mm     G. Age:  37w 4d         45  %    FL/AC:      21.8   %    20 - 24
 HUM:      64.7  mm     G. Age:  37w 4d         72  %
 Est. FW:    9090  gm      7 lb 2 oz     55  %
GESTATIONAL AGE:
 U/S Today:     37w 5d                                        EDD:   07/16/18
 Best:          37w 5d     Det. By:  Early Ultrasound         EDD:   07/16/18
                                     (01/21/18)
ANATOMY:
 Cranium:               Limited Views          Stomach:                Seen
 Heart:                 4-Chamber view         Kidneys:                Normal appearance
                        appears normal
 RVOT:                  Seen                   Bladder:                Seen
 LVOT:                  Seen                   Spine:                  Limited views
 Aortic Arch:           Seen
CERVIX UTERUS ADNEXA:
 Cervix
 Suboptimal

[Series 1: us mfm ob follow-up · 0.28mm/px · 13 of 40 slices shown]
[im 2/40]
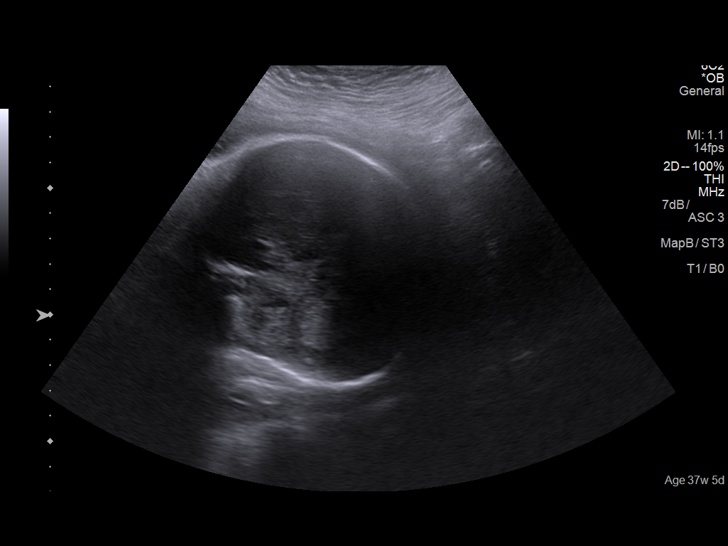
[im 5/40]
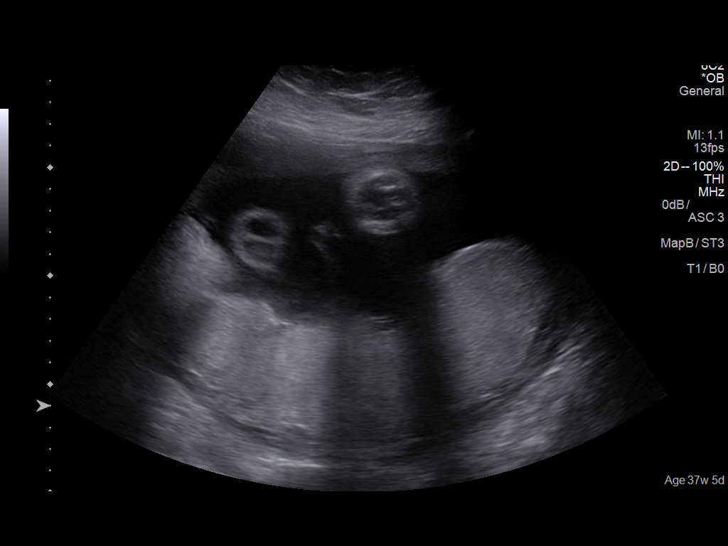
[im 8/40]
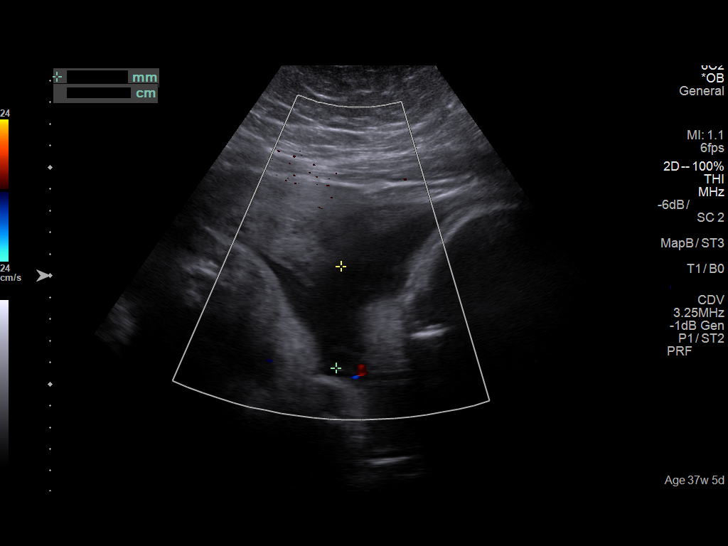
[im 11/40]
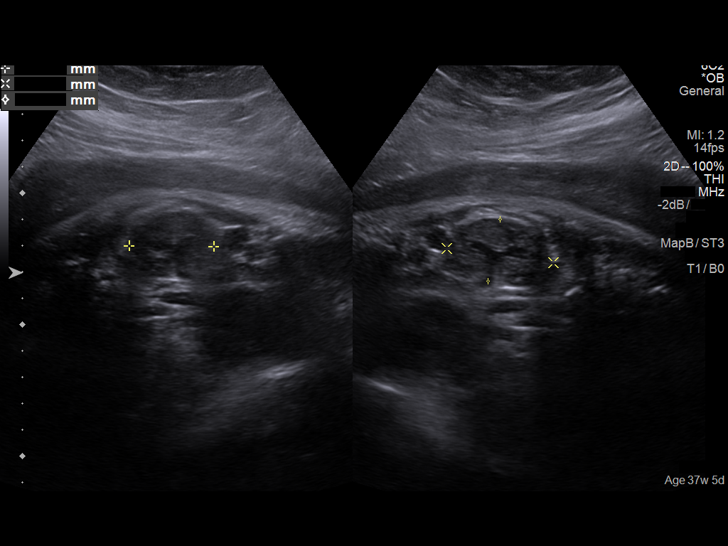
[im 14/40]
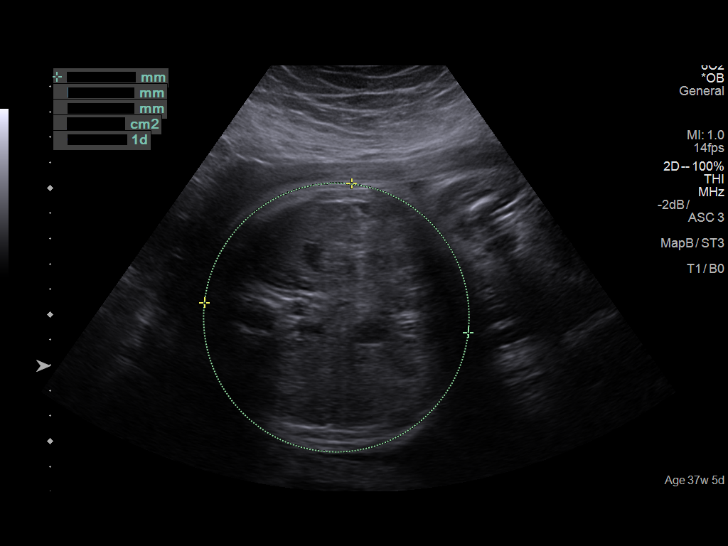
[im 16/40]
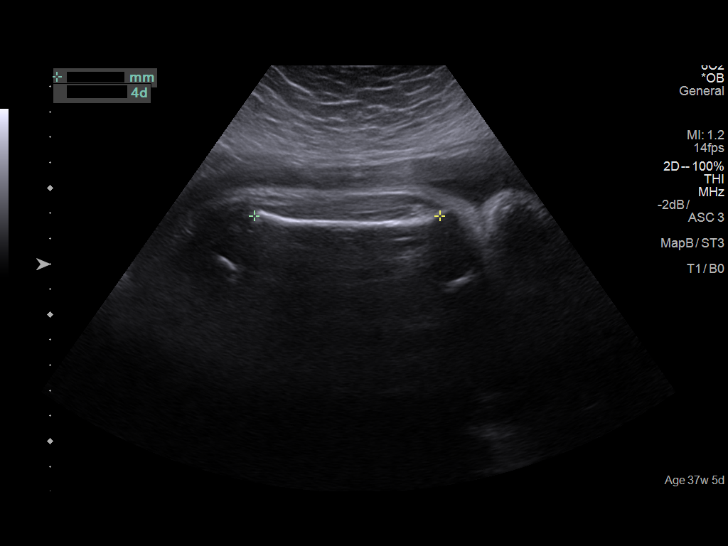
[im 21/40]
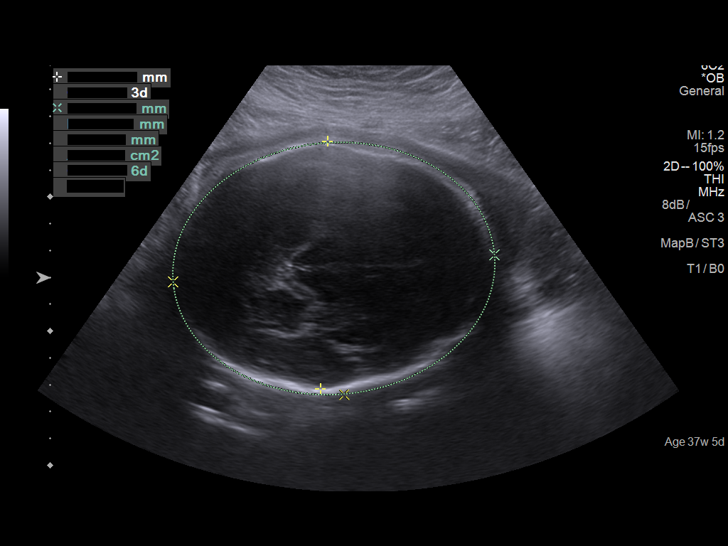
[im 24/40]
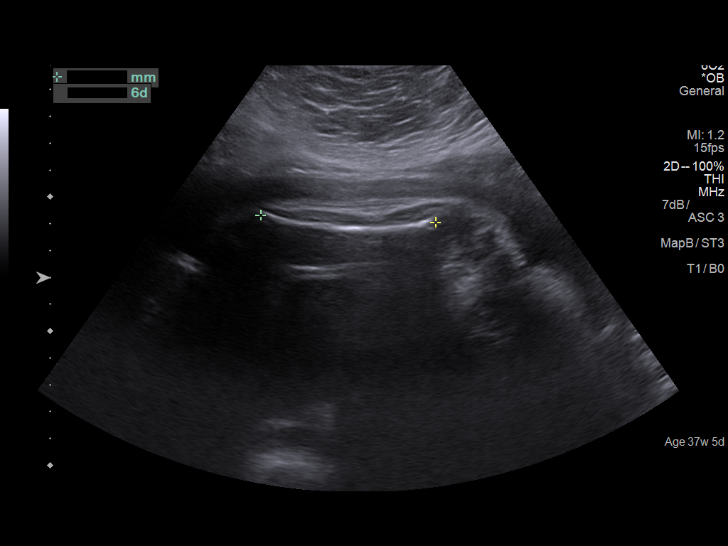
[im 27/40]
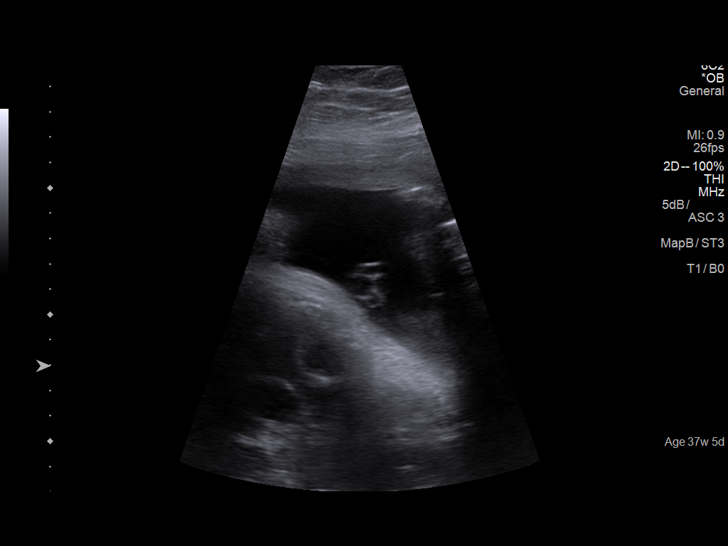
[im 29/40]
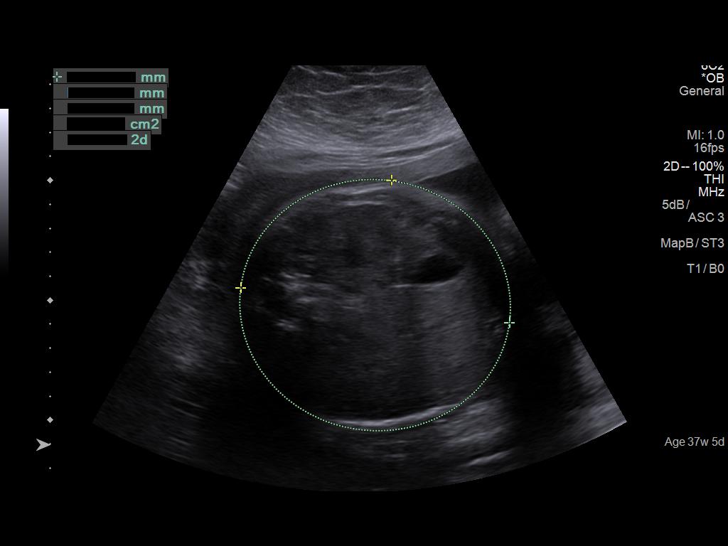
[im 32/40]
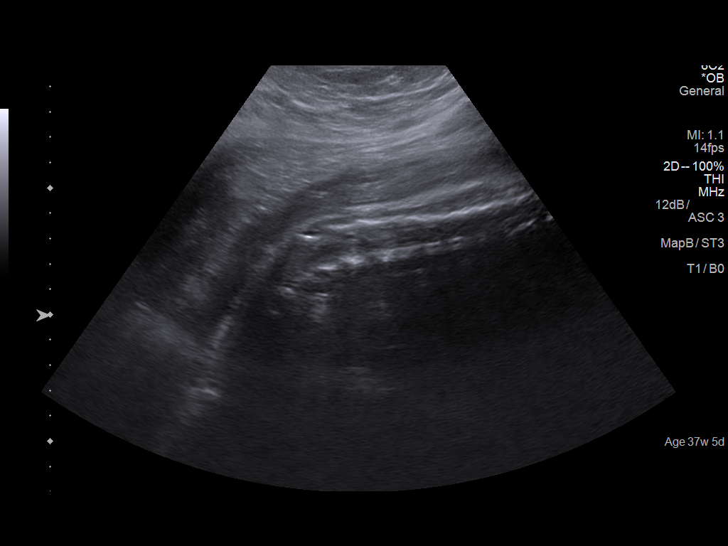
[im 35/40]
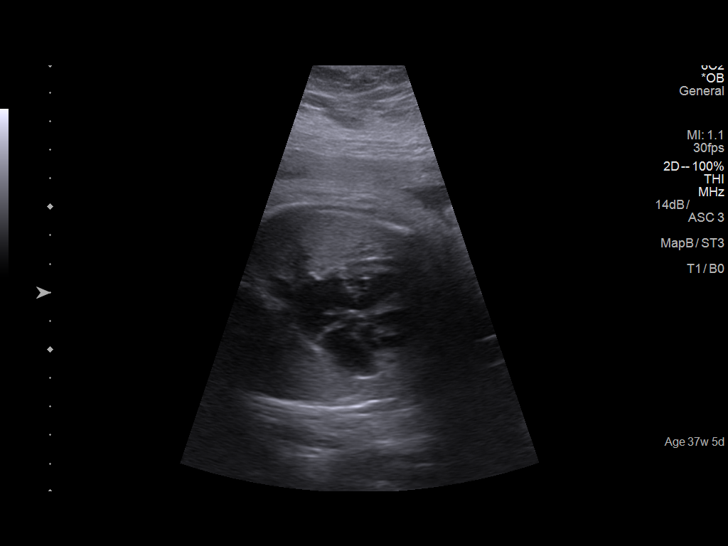
[im 38/40]
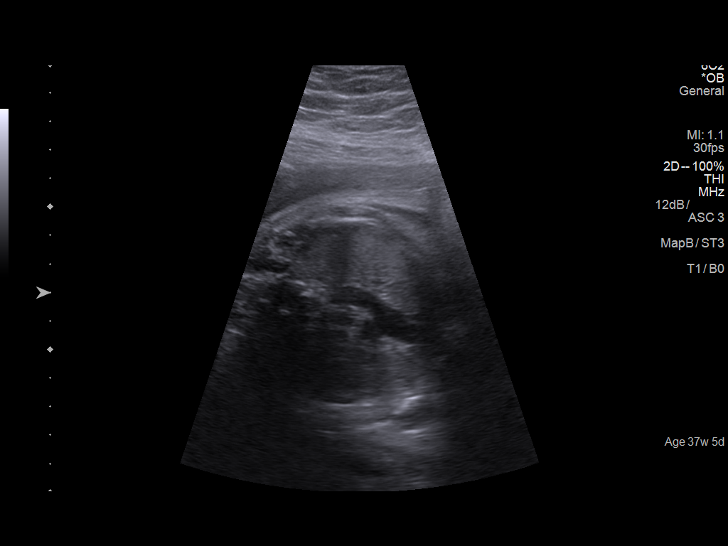

[13 of 28 positions shown; findings below may reference images not displayed]

IMPRESSION: ,

 Thank you for referring your patient for a fetal growth
 evaluation.

 There is a singleton gestation at 37 weeks 5 days.  with
 normal amniotic fluid volume.  Dating is earliest available
 ultrasound performed at [facility] on 01/21/2017;
 measurements were consistent with 14 weeks 6 days.

 The fetal biometry correlates with established dating.
 Adequate interval growth noted.

 Thank you for allowing us to participate in your patient's care.
 assistance.

                  Yohana, Patricia Janet

## 2020-03-24 NOTE — Progress Notes (Unsigned)
PAP Letter mailed today.  Repeat PAP and Physical due 03/2020.  Ante Arredondo, RN  

## 2020-04-06 ENCOUNTER — Telehealth: Payer: Self-pay

## 2020-04-06 NOTE — Telephone Encounter (Signed)
Return call by patient.  Appointment scheduled for her repeat PAP and Physical on 04/13/2020 at 10:40 (arrival time 10:10). Hart Carwin, RN

## 2020-04-06 NOTE — Telephone Encounter (Signed)
Telephone call to patient today regarding the need for a repeat PAP and Physical due November 2021.  Left a message to return my call. Hart Carwin, RN

## 2020-04-13 ENCOUNTER — Ambulatory Visit: Payer: Self-pay

## 2022-10-17 ENCOUNTER — Telehealth: Payer: Self-pay | Admitting: Licensed Clinical Social Worker

## 2022-10-17 NOTE — Telephone Encounter (Signed)
Patient referred by Robin [place], RN due to positive depression screening and history of reported depression.

## 2022-10-24 ENCOUNTER — Ambulatory Visit: Payer: Self-pay | Admitting: Licensed Clinical Social Worker
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
